# Patient Record
Sex: Female | Born: 1960 | Race: White | Hispanic: No | Marital: Married | Smoking: Former smoker
Health system: Southern US, Community
[De-identification: ages and names within clinical notes are randomized; demographics above are authoritative.]

## PROBLEM LIST (undated history)

## (undated) DIAGNOSIS — A64 Unspecified sexually transmitted disease: Secondary | ICD-10-CM

## (undated) DIAGNOSIS — N390 Urinary tract infection, site not specified: Secondary | ICD-10-CM

## (undated) DIAGNOSIS — D649 Anemia, unspecified: Secondary | ICD-10-CM

## (undated) DIAGNOSIS — E039 Hypothyroidism, unspecified: Secondary | ICD-10-CM

## (undated) DIAGNOSIS — R87619 Unspecified abnormal cytological findings in specimens from cervix uteri: Secondary | ICD-10-CM

## (undated) DIAGNOSIS — B2790 Infectious mononucleosis, unspecified without complication: Secondary | ICD-10-CM

## (undated) DIAGNOSIS — R87810 Cervical high risk human papillomavirus (HPV) DNA test positive: Secondary | ICD-10-CM

## (undated) DIAGNOSIS — A630 Anogenital (venereal) warts: Secondary | ICD-10-CM

## (undated) DIAGNOSIS — M1990 Unspecified osteoarthritis, unspecified site: Secondary | ICD-10-CM

## (undated) DIAGNOSIS — J189 Pneumonia, unspecified organism: Secondary | ICD-10-CM

## (undated) DIAGNOSIS — B009 Herpesviral infection, unspecified: Secondary | ICD-10-CM

## (undated) HISTORY — PX: TONSILECTOMY, ADENOIDECTOMY, BILATERAL MYRINGOTOMY AND TUBES: SHX2538

## (undated) HISTORY — DX: Cervical high risk human papillomavirus (HPV) DNA test positive: R87.810

## (undated) HISTORY — DX: Unspecified abnormal cytological findings in specimens from cervix uteri: R87.619

## (undated) HISTORY — PX: CRYOTHERAPY: SHX1416

## (undated) HISTORY — DX: Anogenital (venereal) warts: A63.0

## (undated) HISTORY — DX: Herpesviral infection, unspecified: B00.9

## (undated) HISTORY — DX: Urinary tract infection, site not specified: N39.0

## (undated) HISTORY — DX: Anemia, unspecified: D64.9

## (undated) HISTORY — DX: Unspecified sexually transmitted disease: A64

## (undated) HISTORY — DX: Hypothyroidism, unspecified: E03.9

## (undated) HISTORY — DX: Unspecified osteoarthritis, unspecified site: M19.90

## (undated) HISTORY — DX: Infectious mononucleosis, unspecified without complication: B27.90

## (undated) HISTORY — DX: Pneumonia, unspecified organism: J18.9

## (undated) HISTORY — PX: BREAST CYST ASPIRATION: SHX578

---
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
## 1998-04-21 ENCOUNTER — Other Ambulatory Visit: Admission: RE | Admit: 1998-04-21 | Discharge: 1998-04-21 | Payer: Self-pay | Admitting: Obstetrics and Gynecology

## 1998-08-21 ENCOUNTER — Other Ambulatory Visit: Admission: RE | Admit: 1998-08-21 | Discharge: 1998-08-21 | Payer: Self-pay | Admitting: Obstetrics and Gynecology

## 2000-12-01 ENCOUNTER — Emergency Department (HOSPITAL_COMMUNITY): Admission: EM | Admit: 2000-12-01 | Discharge: 2000-12-02 | Payer: Self-pay | Admitting: Emergency Medicine

## 2001-05-15 ENCOUNTER — Other Ambulatory Visit: Admission: RE | Admit: 2001-05-15 | Discharge: 2001-05-15 | Payer: Self-pay | Admitting: Obstetrics and Gynecology

## 2001-11-30 ENCOUNTER — Ambulatory Visit (HOSPITAL_COMMUNITY): Admission: RE | Admit: 2001-11-30 | Discharge: 2001-11-30 | Payer: Self-pay | Admitting: Obstetrics and Gynecology

## 2001-11-30 ENCOUNTER — Encounter: Payer: Self-pay | Admitting: Obstetrics and Gynecology

## 2002-05-13 ENCOUNTER — Encounter: Admission: RE | Admit: 2002-05-13 | Discharge: 2002-06-12 | Payer: Self-pay | Admitting: Unknown Physician Specialty

## 2005-01-05 ENCOUNTER — Ambulatory Visit (HOSPITAL_COMMUNITY): Admission: RE | Admit: 2005-01-05 | Discharge: 2005-01-05 | Payer: Self-pay | Admitting: Unknown Physician Specialty

## 2006-11-21 ENCOUNTER — Ambulatory Visit (HOSPITAL_COMMUNITY): Admission: RE | Admit: 2006-11-21 | Discharge: 2006-11-21 | Payer: Self-pay | Admitting: Family Medicine

## 2007-02-06 ENCOUNTER — Ambulatory Visit: Payer: Self-pay | Admitting: Family Medicine

## 2007-09-21 ENCOUNTER — Other Ambulatory Visit: Admission: RE | Admit: 2007-09-21 | Discharge: 2007-09-21 | Payer: Self-pay | Admitting: Obstetrics & Gynecology

## 2007-11-29 ENCOUNTER — Ambulatory Visit: Payer: Self-pay | Admitting: Family Medicine

## 2007-11-29 DIAGNOSIS — J069 Acute upper respiratory infection, unspecified: Secondary | ICD-10-CM | POA: Insufficient documentation

## 2007-11-29 LAB — CONVERTED CEMR LAB: Rapid Strep: NEGATIVE

## 2007-12-05 ENCOUNTER — Ambulatory Visit (HOSPITAL_COMMUNITY): Admission: RE | Admit: 2007-12-05 | Discharge: 2007-12-05 | Payer: Self-pay | Admitting: Obstetrics and Gynecology

## 2008-11-27 ENCOUNTER — Ambulatory Visit: Payer: Self-pay | Admitting: Family Medicine

## 2008-11-27 DIAGNOSIS — E039 Hypothyroidism, unspecified: Secondary | ICD-10-CM | POA: Insufficient documentation

## 2008-11-27 DIAGNOSIS — H6590 Unspecified nonsuppurative otitis media, unspecified ear: Secondary | ICD-10-CM | POA: Insufficient documentation

## 2008-11-28 ENCOUNTER — Encounter (INDEPENDENT_AMBULATORY_CARE_PROVIDER_SITE_OTHER): Payer: Self-pay | Admitting: *Deleted

## 2008-11-28 LAB — CONVERTED CEMR LAB: TSH: 2.48 microintl units/mL (ref 0.35–5.50)

## 2009-05-13 ENCOUNTER — Ambulatory Visit: Payer: Self-pay | Admitting: Family Medicine

## 2009-05-14 ENCOUNTER — Telehealth (INDEPENDENT_AMBULATORY_CARE_PROVIDER_SITE_OTHER): Payer: Self-pay | Admitting: *Deleted

## 2009-05-14 LAB — CONVERTED CEMR LAB
Basophils Relative: 1.6 % (ref 0.0–3.0)
Calcium: 9 mg/dL (ref 8.4–10.5)
Eosinophils Absolute: 0.1 10*3/uL (ref 0.0–0.7)
Glucose, Bld: 66 mg/dL — ABNORMAL LOW (ref 70–99)
HCT: 36.8 % (ref 36.0–46.0)
Hemoglobin: 12.5 g/dL (ref 12.0–15.0)
Lymphs Abs: 1.4 10*3/uL (ref 0.7–4.0)
MCHC: 34 g/dL (ref 30.0–36.0)
Monocytes Absolute: 0.2 10*3/uL (ref 0.1–1.0)
RBC: 4.12 M/uL (ref 3.87–5.11)

## 2009-09-14 ENCOUNTER — Ambulatory Visit (HOSPITAL_COMMUNITY): Admission: RE | Admit: 2009-09-14 | Discharge: 2009-09-14 | Payer: Self-pay | Admitting: Obstetrics and Gynecology

## 2009-09-18 ENCOUNTER — Encounter: Admission: RE | Admit: 2009-09-18 | Discharge: 2009-09-18 | Payer: Self-pay | Admitting: Obstetrics and Gynecology

## 2009-11-20 ENCOUNTER — Ambulatory Visit: Payer: Self-pay | Admitting: Family Medicine

## 2009-11-20 ENCOUNTER — Telehealth: Payer: Self-pay | Admitting: Family Medicine

## 2009-11-20 LAB — CONVERTED CEMR LAB
Ketones, urine, test strip: NEGATIVE
Specific Gravity, Urine: <1.005
Urobilinogen, UA: 0.2

## 2009-11-21 ENCOUNTER — Encounter: Payer: Self-pay | Admitting: Family Medicine

## 2009-11-23 ENCOUNTER — Telehealth (INDEPENDENT_AMBULATORY_CARE_PROVIDER_SITE_OTHER): Payer: Self-pay | Admitting: *Deleted

## 2010-02-15 ENCOUNTER — Ambulatory Visit: Payer: Self-pay | Admitting: Family Medicine

## 2010-02-15 ENCOUNTER — Ambulatory Visit: Payer: Self-pay | Admitting: Radiology

## 2010-02-15 ENCOUNTER — Ambulatory Visit (HOSPITAL_BASED_OUTPATIENT_CLINIC_OR_DEPARTMENT_OTHER): Admission: RE | Admit: 2010-02-15 | Discharge: 2010-02-15 | Payer: Self-pay | Admitting: Family Medicine

## 2010-02-15 ENCOUNTER — Telehealth (INDEPENDENT_AMBULATORY_CARE_PROVIDER_SITE_OTHER): Payer: Self-pay | Admitting: *Deleted

## 2010-02-15 DIAGNOSIS — R109 Unspecified abdominal pain: Secondary | ICD-10-CM | POA: Insufficient documentation

## 2010-02-15 DIAGNOSIS — M25559 Pain in unspecified hip: Secondary | ICD-10-CM | POA: Insufficient documentation

## 2010-02-15 LAB — CONVERTED CEMR LAB
ALT: 15 units/L (ref 0–35)
Alkaline Phosphatase: 40 units/L (ref 39–117)
Bilirubin Urine: NEGATIVE
Bilirubin, Direct: 0.1 mg/dL (ref 0.0–0.3)
Blood in Urine, dipstick: NEGATIVE
Chloride: 102 meq/L (ref 96–112)
Eosinophils Relative: 0.7 % (ref 0.0–5.0)
Hemoglobin: 13.2 g/dL (ref 12.0–15.0)
MCV: 88.9 fL (ref 78.0–100.0)
Monocytes Relative: 9.1 % (ref 3.0–12.0)
Neutro Abs: 2.3 10*3/uL (ref 1.4–7.7)
Neutrophils Relative %: 51.8 % (ref 43.0–77.0)
Nitrite: NEGATIVE
Potassium: 4.1 meq/L (ref 3.5–5.1)
Protein, U semiquant: NEGATIVE
Sodium: 140 meq/L (ref 135–145)
Specific Gravity, Urine: 1.015
Total Protein: 6.9 g/dL (ref 6.0–8.3)
WBC Urine, dipstick: NEGATIVE
pH: 7

## 2010-02-16 ENCOUNTER — Telehealth: Payer: Self-pay | Admitting: Family Medicine

## 2010-02-16 ENCOUNTER — Telehealth: Payer: Self-pay | Admitting: Gastroenterology

## 2010-02-17 ENCOUNTER — Encounter (INDEPENDENT_AMBULATORY_CARE_PROVIDER_SITE_OTHER): Payer: Self-pay | Admitting: *Deleted

## 2010-02-17 ENCOUNTER — Ambulatory Visit: Payer: Self-pay | Admitting: Gastroenterology

## 2010-02-18 ENCOUNTER — Encounter: Payer: Self-pay | Admitting: Gastroenterology

## 2010-02-26 ENCOUNTER — Encounter: Payer: Self-pay | Admitting: Family Medicine

## 2010-03-02 ENCOUNTER — Encounter: Admission: RE | Admit: 2010-03-02 | Discharge: 2010-03-02 | Payer: Self-pay | Admitting: *Deleted

## 2010-03-17 ENCOUNTER — Encounter: Payer: Self-pay | Admitting: Family Medicine

## 2010-03-19 ENCOUNTER — Telehealth (INDEPENDENT_AMBULATORY_CARE_PROVIDER_SITE_OTHER): Payer: Self-pay | Admitting: *Deleted

## 2010-03-23 ENCOUNTER — Encounter: Admission: RE | Admit: 2010-03-23 | Discharge: 2010-03-23 | Payer: Self-pay | Admitting: Obstetrics and Gynecology

## 2010-04-07 ENCOUNTER — Encounter: Payer: Self-pay | Admitting: Family Medicine

## 2010-12-04 ENCOUNTER — Encounter: Payer: Self-pay | Admitting: Unknown Physician Specialty

## 2010-12-16 NOTE — Progress Notes (Signed)
Summary: Stabbing abd pain  Phone Note Call from Patient   Caller: Renee @ Dr Laury Axon [phone] Call For: Dr Jarold Motto (Doc of the Day) Summary of Call: Stabbing lower right abd pain that moves around. [place] scared she might have a parasite. First available appt May 6th-wonders if we can see her before then? Initial call taken by: Leanor Kail [facility],  February 16, 2010 3:08 PM  Follow-up for Phone Call        this is not appropriate to send to the MD..neverseen in GI  (doc of the day 02/16/10) Follow-up by: Mardella Layman MD Clementeen Graham,  February 17, 2010 8:27 AM  Additional Follow-up for Phone Call Additional follow up Details #1::        I will see her this AM.  (doc of the day 02/17/10) Additional Follow-up by: Rachael Fee MD,  February 17, 2010 9:20 AM     Appended Document: Stabbing abd pain Thanks-- I didn't know all this was going on--- her exam was not impressive when I saw her and she was having no diarrhea etc.   Thanks for seeing her.

## 2010-12-16 NOTE — Procedures (Signed)
Summary: Colonoscopy/[facility]  Colonoscopy/[facility]   Imported By: Lanelle Bal 04/21/2010 11:02:47  _____________________________________________________________________  External Attachment:    Type:   Image     Comment:   External Document

## 2010-12-16 NOTE — Progress Notes (Signed)
Summary: Concerns  Phone Note Call from Patient Call back at Work Phone [phone]   Summary of Call: Pt called and was asking if we think it could be a parasite from when she was on her mission trip. I told her since her symptoms resolved in July then more than likely not. She is concerned because the pain moves around. Do you have any suggestions? Army Fossa CMA  February 16, 2010 1:45 PM   Follow-up for Phone Call        PARASITE WOULD CAUSE DIARRHEA, FEVER ETC.   ---   HAVE RENEE DO THE gi REFERRAL--- i PUT IT IN YESTERDAY Follow-up by: Loreen Freud DO,  February 16, 2010 2:05 PM  Additional Follow-up for Phone Call Additional follow up Details #1::        Pt is aware. Army Fossa CMA  February 16, 2010 2:14 PM

## 2010-12-16 NOTE — Progress Notes (Signed)
Summary: Lab Results   Phone Note Outgoing Call   Summary of Call: Regarding results, LMTCB:  normal--  no UTI Initial call taken by: Army Fossa CMA,  November 23, 2009 12:54 PM  Follow-up for Phone Call        Pt states since november she has had a pain in her abdomen sometimes seemed related to her hip. She said that when she has a full bladder she has the pain in her abdomen. She says her UTI symptoms have cleared up mostly, still having a very small amount of discomfort. Any suggestions?  Follow-up by: Army Fossa CMA,  November 23, 2009 2:22 PM  Additional Follow-up for Phone Call Additional follow up Details #1::        She probably needs a gyn exam--if it has not been done recently---- gyn can do it or we can.  --- to find out if it is urinary or abd.   Additional Follow-up by: Loreen Freud DO,  November 23, 2009 4:48 PM    Additional Follow-up for Phone Call Additional follow up Details #2::    lmtcb. Army Fossa CMA  November 23, 2009 5:01 PM  Additional Follow-up for Phone Call Additional follow up Details #3:: Details for Additional Follow-up Action Taken: I spoke with pt and she stated that she is going to call her GYN and inform them of the new problem and set up an appt. Army Fossa CMA  November 24, 2009 10:23 AM

## 2010-12-16 NOTE — Progress Notes (Signed)
  Phone Note Other Incoming   Request: Send information Summary of Call: Request for records received from Dr. Carman Ching. Records faxed to [phone].

## 2010-12-16 NOTE — Assessment & Plan Note (Signed)
History of Present Illness Visit Type: Initial Consult Primary GI MD: Rob Bunting MD Primary Provider: Loreen Freud, MD Requesting Provider: Loreen Freud, MD Chief Complaint: Lower abdominal, parasites? History of Present Illness:     a very pleasant 50 year old woman who was recently in [place], 9 months ago. Came down with diarrhea, loose, every AM.  Would go 8-10 times every morning.  She never saw blood in stool. Took a "long time" before , she became seriously constipated for a week, then tood MOM, then more, diarrhrea returned.  She became fairly regulated, back to normal for the most part but was still very bothered by urgency in AM at times.  Prior to these issues, was having easy BM every moring.  She will have a BM every day, sometimes has to push and strain.  Started amitiza trial earlier this week.  Started to right sided abd pain, intermittent, started 4-5 months ago. Gynecologst exam was normal. The pain is constant now.  the pain is not improved when she moves her bowels. There is no associated nausea or vomiting. The pain is mild to moderate.  She had blood work earlierThis week show a normal CBC, normal complete metabolic profile.           Current Medications (verified): 1)  Synthroid 75 Mcg  Tabs (Levothyroxine Sodium) .... Take One Tablet Daily 2)  Amitiza 8 Mcg Caps (Lubiprostone) .Marland Kitchen.. 1 By Mouth Two Times A Day 3)  Multivitamins  Tabs (Multiple Vitamin) .... Take One By Mouth Once Daily  Allergies (verified): No Known Drug Allergies  Past History:  Past Medical History: Hypothyroidism pneumonia 1961 Urinary tract infections  Past Surgical History: none  Family History: grandparent had colon cancer Mother has some type of colitis Father died of sarcoma  Social History: she is married, she has one son, she works as a Patent attorney consulted, she does not smoke cigarettes, she drinks one alcoholic beverage per day she drinks 3 caffeinated  beverages per day  Review of Systems       Pertinent positive and negative review of systems were noted in the above HPI and GI specific review of systems.  All other review of systems was otherwise negative.   Vital Signs:  Patient profile:   50 year old female Height:      67.5 inches Weight:      139.4 pounds BMI:     21.59 Pulse rate:   80 / minute Pulse rhythm:   regular BP sitting:   100 / 64  (left arm) Cuff size:   regular  Vitals Entered By: Harlow Mares CMA Duncan Dull) (February 17, 2010 10:46 AM)  Physical Exam  Additional Exam:  Constitutional: generally well appearing Psychiatric: alert and oriented times 3 Eyes: extraocular movements intact Mouth: oropharynx moist, no lesions Neck: supple, no lymphadenopathy Cardiovascular: heart regular rate and rythm Lungs: CTA bilaterally Abdomen: soft, non-tender, non-distended, no obvious ascites, no peritoneal signs, normal bowel sounds Extremities: no lower extremity edema bilaterally Skin: no lesions on visible extremities    Impression & Recommendations:  Problem # 1:  right lower quadrant pain, change in bowel habits 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 A severe bacterial or viral infection such as the one she had while traveling overseas last summer can be the start of IBS chronically. The mechanism may be disruption of usual neural pathways. She is very clear that her bowels have not been normal since that acute diarrheal illness. She is also becoming more  and more bothered by a right lower quadrant pain. Perhaps she has underlying inflammatory bowel disease. She does have colitis her family and colon cancer in a grandmother. I think we should proceed with colonoscopy at her soonest convenience. I have also recommended that she try fiber supplements to try to regulate her bowels a bit better. She will stop the [place] , she is not very dramatically constipated. She will also get some routine stool tests including fecal  leukocytes, ova parasites, Giardia.  Other Orders: T-Stool for O&P (04540-98119) T-Fecal WBC ([id number]) T-Stool Giardia / Crypto- EIA (30865)  Patient Instructions: 1)  Stop the amitiza. 2)  You should begin taking citrucel powder fiber supplement (orange flavor).  Start with a small spoonful and increase this over 1 week to a full, heaping spoonful daily.  You may notice some bloating when you first start the fiber, but that usually resolves after a few days. 3)  You will be scheduled to have a colonoscopy. 4)  You will get lab test(s) done today (stool for ova/parasites, fecal leukocytes, giardia). 5)  A copy of this information will be sent to Dr. Laury Axon.  6)  The medication list was reviewed and reconciled.  All changed / newly prescribed medications were explained.  A complete medication list was provided to the patient / caregiver.  Appended Document: Orders Update/movi    Clinical Lists Changes  Medications: Added new medication of MOVIPREP 100 GM  SOLR (PEG-KCL-NACL-NASULF-NA ASC-C) As per prep instructions. - Signed Rx of MOVIPREP 100 GM  SOLR (PEG-KCL-NACL-NASULF-NA ASC-C) As per prep instructions.;  #1 x 0;  Signed;  Entered by: Chales Abrahams CMA (AAMA);  Authorized by: Rachael Fee MD;  Method used: Electronically to [id number]*, [address], [place], [place], [place]  [address], Ph: [phone], Fax: [phone] Orders: Added new Test order of Colonoscopy (Colon) - Signed    Prescriptions: MOVIPREP 100 GM  SOLR (PEG-KCL-NACL-NASULF-NA ASC-C) As per prep instructions.  #1 x 0   Entered by:   Chales Abrahams CMA (AAMA)   Authorized by:   Rachael Fee MD   Signed by:   Chales Abrahams CMA (AAMA) on 02/17/2010   Method used:   Electronically to        [facility] [address] [id number] (retail)       [address]       [place], [place]  [address]       Ph: [phone]       Fax: [phone]   RxID:    [id number]    Appended Document:  please call her,  she sent a letter asking about the differential diagnosis at this point.  Tell her 1. Post infectious IBS.  2. Mild inflammatory bowel syndrome.  3. chronic infection (although stool testing is negative so far.    colonoscopy will help figure out which it is.  Appended Document:  pt wants Dr Christella Hartigan to know that she has a strong family history of gallbladder disease.  Her mothers first cousin died from gallbladder disease and siblings have had gallbladder removed.  Appended Document:  ok

## 2010-12-16 NOTE — Letter (Signed)
Summary: [facility]   Imported By: Lanelle Bal 03/23/2010 12:28:52  _____________________________________________________________________  External Attachment:    Type:   Image     Comment:   External Document

## 2010-12-16 NOTE — Letter (Signed)
Summary: [facility]  [facility]   Imported By: Lanelle Bal 03/04/2010 12:24:50  _____________________________________________________________________  External Attachment:    Type:   Image     Comment:   External Document

## 2010-12-16 NOTE — Progress Notes (Signed)
Summary: Lab results  Phone Note Outgoing Call   Call placed by: Army Fossa CMA,  February 15, 2010 4:56 PM Summary of Call: Regarding lab results, LMTCB:  Hip X-ray: Normal All labs: Normal  Follow-up for Phone Call        Pt is aware. Army Fossa CMA  February 15, 2010 4:58 PM

## 2010-12-16 NOTE — Letter (Signed)
Summary: [facility] Instructions  [facility]  [address] [place], [place] [address]   Phone: [phone]  Fax: [phone]       Cassandra Alvarez    1960/12/24    MRN: [id number]        Procedure Day /Date:03/10/10 WED     Arrival Time:130 pm     Procedure Time:230 pm     Location of Procedure:                    X  [facility] (4th Floor)                        PREPARATION FOR COLONOSCOPY WITH MOVIPREP   Starting 5 days prior to your procedure 03/05/10 do not eat nuts, seeds, popcorn, corn, beans, peas,  salads, or any raw vegetables.  Do not take any fiber supplements (e.g. Metamucil, Citrucel, and Benefiber).  THE DAY BEFORE YOUR PROCEDURE         DATE: 03/09/10  DAY: TUE  1.  Drink clear liquids the entire day-NO SOLID FOOD  2.  Do not drink anything colored red or purple.  Avoid juices with pulp.  No orange juice.  3.  Drink at least 64 oz. (8 glasses) of fluid/clear liquids during the day to prevent dehydration and help the prep work efficiently.  CLEAR LIQUIDS INCLUDE: Water Jello Ice Popsicles Tea (sugar ok, no milk/cream) Powdered fruit flavored drinks Coffee (sugar ok, no milk/cream) Gatorade Juice: apple, white grape, white cranberry  Lemonade Clear bullion, consomm, broth Carbonated beverages (any kind) Strained chicken noodle soup Hard Candy                             4.  In the morning, mix first dose of MoviPrep solution:    Empty 1 Pouch A and 1 Pouch B into the disposable container    Add lukewarm drinking water to the top line of the container. Mix to dissolve    Refrigerate (mixed solution should be used within 24 hrs)  5.  Begin drinking the prep at 5:00 p.m. The MoviPrep container is divided by 4 marks.   Every 15 minutes drink the solution down to the next mark (approximately 8 oz) until the full liter is complete.   6.  Follow completed prep with 16 oz of clear liquid of your choice (Nothing red or purple).   Continue to drink clear liquids until bedtime.  7.  Before going to bed, mix second dose of MoviPrep solution:    Empty 1 Pouch A and 1 Pouch B into the disposable container    Add lukewarm drinking water to the top line of the container. Mix to dissolve    Refrigerate  THE DAY OF YOUR PROCEDURE      DATE: 4/27/11DAY: WED  Beginning at 930 a.m. (5 hours before procedure):         1. Every 15 minutes, drink the solution down to the next mark (approx 8 oz) until the full liter is complete.  2. Follow completed prep with 16 oz. of clear liquid of your choice.    3. You may drink clear liquids until 1230 pm (2 HOURS BEFORE PROCEDURE).   MEDICATION INSTRUCTIONS  Unless otherwise instructed, you should take regular prescription medications with a small sip of water   as early as possible the morning of your procedure.  OTHER INSTRUCTIONS  You will need a responsible adult at least 51 years of age to accompany you and drive you home.   This person must remain in the waiting room during your procedure.  Wear loose fitting clothing that is easily removed.  Leave jewelry and other valuables at home.  However, you may wish to bring a book to read or  an iPod/MP3 player to listen to music as you wait for your procedure to start.  Remove all body piercing jewelry and leave at home.  Total time from sign-in until discharge is approximately 2-3 hours.  You should go home directly after your procedure and rest.  You can resume normal activities the  day after your procedure.  The day of your procedure you should not:   Drive   Make legal decisions   Operate machinery   Drink alcohol   Return to work  You will receive specific instructions about eating, activities and medications before you leave.    The above instructions have been reviewed and explained to me by   _______________________    I fully understand and can verbalize these instructions  _____________________________ Date _________

## 2010-12-16 NOTE — Assessment & Plan Note (Signed)
Summary: re-est//pain in side//lch   Vital Signs:  Patient profile:   50 year old female Height:      67.5 inches Weight:      138 pounds BMI:     21.37 Pulse rate:   62 / minute Pulse rhythm:   regular BP sitting:   104 / 68  (left arm) Cuff size:   regular  Vitals Entered By: Army Fossa CMA (February 15, 2010 9:10 AM) CC: Pt here for discomfort in left lower abdomen, x 5 months. Sometimes it is a stabbing pain, or a burning pain that comes and goes., Abdominal Pain   History of Present Illness:       This is a 50 year old woman who presents with Abdominal Pain.  The symptoms began 6 days ago.  Pt here c/o 6 month hx abd pain R low quad--  alt loose stool and constipation- mostly constipation.  Loose stool mostly only when she takes something.  The patient reports constipation, but denies nausea, vomiting, diarrhea, melena, hematochezia, anorexia, and hematemesis.  The location of the pain is right upper quadrant.  The pain is described as constant and sharp.  The patient denies the following symptoms: fever, weight loss, dysuria, chest pain, jaundice, dark urine, missed menstrual period, and vaginal bleeding.    Current Medications (verified): 1)  Synthroid 75 Mcg  Tabs (Levothyroxine Sodium) .... Take One Tablet Daily 2)  Amitiza 8 Mcg Caps (Lubiprostone) .Marland Kitchen.. 1 By Mouth Two Times A Day  Allergies (verified): No Known Drug Allergies  Past History:  Past medical, surgical, family and social histories (including risk factors) reviewed for relevance to current acute and chronic problems.  Past Medical History: Reviewed history from 11/27/2008 and no changes required. Hypothyroidism  Family History: Reviewed history and no changes required.  Social History: Reviewed history and no changes required.  Review of Systems      See HPI  Physical Exam  General:  Well-developed,well-nourished,in no acute distress; alert,appropriate and cooperative throughout  examination Abdomen:  Bowel sounds positive,abdomen soft and non-tender without masses, organomegaly or hernias noted. Msk:  normal ROM, no joint tenderness, no joint swelling, no joint warmth, no redness over joints, no joint deformities, and no crepitation.   Extremities:  No clubbing, cyanosis, edema, or deformity noted with normal full range of motion of all joints.   Neurologic:  alert & oriented X3 and strength normal in all extremities.   Skin:  Intact without suspicious lesions or rashes Psych:  Oriented X3 and normally interactive.     Impression & Recommendations:  Problem # 1:  ABDOMINAL PAIN OTHER SPECIFIED SITE (ICD-789.09)  ? IBS---amitiza 8 mg two times a day  refer to GI try align as well Orders: T-Hip Comp Right Min 2 views (73510TC) Gastroenterology Referral (GI) TLB-TSH (Thyroid Stimulating Hormone) (84443-TSH) TLB-BMP (Basic Metabolic Panel-BMET) (80048-METABOL) TLB-CBC Platelet - w/Differential (85025-CBCD) TLB-Hepatic/Liver Function Pnl (80076-HEPATIC)  Discussed use of medications, application of heat or cold, and exercises.   Problem # 2:  HIP PAIN, RIGHT (ICD-719.45) hx djd  Orders: T-Hip Comp Right Min 2 views (73510TC)  Discussed use of medications, application of heat or cold, and exercises.   Problem # 3:  HYPOTHYROIDISM (ICD-244.9)  Her updated medication list for this problem includes:    Synthroid 75 Mcg Tabs (Levothyroxine sodium) .Marland Kitchen... Take one tablet daily  Orders: TLB-TSH (Thyroid Stimulating Hormone) (84443-TSH) TLB-BMP (Basic Metabolic Panel-BMET) (80048-METABOL) TLB-CBC Platelet - w/Differential (85025-CBCD) TLB-Hepatic/Liver Function Pnl (80076-HEPATIC)  Labs Reviewed: TSH:  2.48 (11/27/2008)     Complete Medication List: 1)  Synthroid 75 Mcg Tabs (Levothyroxine sodium) .... Take one tablet daily 2)  Amitiza 8 Mcg Caps (Lubiprostone) .Marland Kitchen.. 1 by mouth two times a day  Other Orders: Venipuncture (29518) Prescriptions: AMITIZA 8  MCG CAPS (LUBIPROSTONE) 1 by mouth two times a day  #60 x 2   Entered and Authorized by:   Loreen Freud DO   Signed by:   Loreen Freud DO on 02/15/2010   Method used:   Print then Give to Patient   RxID:   [id number]   Laboratory Results   Urine Tests    Routine Urinalysis   Color: yellow Appearance: Clear Glucose: negative   (Normal Range: Negative) Bilirubin: negative   (Normal Range: Negative) Ketone: negative   (Normal Range: Negative) Spec. Gravity: 1.015   (Normal Range: 1.003-1.035) Blood: negative   (Normal Range: Negative) pH: 7.0   (Normal Range: 5.0-8.0) Protein: negative   (Normal Range: Negative) Urobilinogen: 0.2   (Normal Range: 0-1) Nitrite: negative   (Normal Range: Negative) Leukocyte Esterace: negative   (Normal Range: Negative)    Comments: Army Fossa CMA  February 15, 2010 9:17 AM

## 2010-12-16 NOTE — Progress Notes (Signed)
Summary: UTI  Phone Note Call from Patient   Summary of Call: Pt called and stated that she has urgency and discomfort when urinating- she states she will come and leave a urine sample but would like you to call something into [facility] [address]. She says she cannot wait until we process her urine.  Initial call taken by: Army Fossa CMA,  November 20, 2009 9:42 AM  Follow-up for Phone Call        I need the urine sample before she starts medication.  It can mess up the culture. Follow-up by: Loreen Freud DO,  November 20, 2009 10:28 AM  Additional Follow-up for Phone Call Additional follow up Details #1::        Pt is aware to not take meds until after urine sample is given. She will come by and leave UA. Husbands office called in meds.   Rene Kocher- she is coming to leave a UA- culture to. she is on ur schedule.  Additional Follow-up by: Army Fossa CMA,  November 20, 2009 10:30 AM    cx sent

## 2011-01-04 ENCOUNTER — Other Ambulatory Visit: Payer: Self-pay | Admitting: Obstetrics and Gynecology

## 2011-01-04 DIAGNOSIS — Z1231 Encounter for screening mammogram for malignant neoplasm of breast: Secondary | ICD-10-CM

## 2011-01-20 ENCOUNTER — Ambulatory Visit
Admission: RE | Admit: 2011-01-20 | Discharge: 2011-01-20 | Disposition: A | Discharge status: Home / Self Care | Payer: BC Managed Care – PPO | Source: Ambulatory Visit | Attending: Obstetrics and Gynecology | Admitting: Obstetrics and Gynecology

## 2011-01-20 DIAGNOSIS — Z1231 Encounter for screening mammogram for malignant neoplasm of breast: Secondary | ICD-10-CM

## 2011-08-26 ENCOUNTER — Ambulatory Visit (INDEPENDENT_AMBULATORY_CARE_PROVIDER_SITE_OTHER): Payer: BC Managed Care – PPO | Admitting: Family Medicine

## 2011-08-26 ENCOUNTER — Encounter: Payer: Self-pay | Admitting: Family Medicine

## 2011-08-26 VITALS — BP 110/72 | HR 82 | Temp 97.0°F | Wt 127.0 lb

## 2011-08-26 DIAGNOSIS — J069 Acute upper respiratory infection, unspecified: Secondary | ICD-10-CM

## 2011-08-26 MED ORDER — FLUTICASONE PROPIONATE 50 MCG/ACT NA SUSP: NASAL | Status: DC

## 2011-08-26 MED ORDER — CEFUROXIME AXETIL 500 MG PO TABS: 500.0000 mg | ORAL_TABLET | Freq: Two times a day (BID) | ORAL | Status: AC

## 2011-08-26 NOTE — Patient Instructions (Signed)
Common Cold, Adult An upper respiratory tract infection, or cold, is a viral infection of the air passages to the lung. Colds are contagious, especially during the first 3 or 4 days. Antibiotics cannot cure a cold. Cold germs are spread by coughs, sneezes, and hand to hand contact. A respiratory tract infection usually clears up in a few days, but some people may be sick for a week or two. HOME CARE INSTRUCTIONS  Only take over-the-counter or prescription medicines for pain, discomfort, or fever as directed by your caregiver.   Be careful not to blow your nose too hard. This may cause a nosebleed.   Use a cool-mist humidifier (vaporizer) to increase air moisture. This will make it easier for you to breath. Do not use hot steam.   Rest as much as possible and get plenty of sleep.   Wash your hands often, especially after you blow your nose. Cover your mouth and nose with a tissue when you sneeze or cough.   Drink at least 8 glasses of clear liquids every day, such as water, fruit juices, tea, clear soups, and carbonated beverages.  SEEK MEDICAL CARE IF:  An oral temperature above 100.4 lasts 4 days or more, and is not controlled by medication.   You have a sore throat that gets worse or you see white or yellow spots in your throat.   Your cough gets worse or lasts more than 10 days.   You have a rash somewhere on your skin. You have large and tender lumps in your neck.   You have an earache or a headache.   You have thick, greenish or yellowish discharge from your nose.   You cough-up thick yellow, green, gray or bloody mucus (secretions).  SEEK IMMEDIATE MEDICAL CARE IF: You have trouble breathing, chest pain, or your skin or nails look gray or blue. MAKE SURE YOU:   Understand these instructions.   Will watch your condition.   Will get help right away if you are not doing well or get worse.  Document Released: 10/28/2000 Document Re-Released: 10/13/2008 [employer] Patient  Information 2011 [employer]. 

## 2011-08-26 NOTE — Progress Notes (Signed)
  Subjective:     Cassandra Alvarez is a 50 y.o. female who presents for evaluation of symptoms of a URI. Symptoms include bilateral ear pressure/pain, congestion, nasal congestion and sinus pressure. Onset of symptoms was 5 days ago, and has been gradually worsening since that time. Treatment to date: antihistamines and decongestants.  The following portions of the patient's history were reviewed and updated as appropriate: allergies, current medications, past family history, past medical history, past social history, past surgical history and problem list.  Review of Systems Pertinent items are noted in HPI.   Objective:    BP 110/72  Pulse 82  Temp(Src) 97 F (36.1 C) (Oral)  Wt 127 lb (57.607 kg)  SpO2 96% General appearance: alert, cooperative, appears stated age and no distress Ears: + fluid b/l --- R>L Nose: Nares normal. Septum midline. Mucosa normal. No drainage or sinus tenderness. Throat: lips, mucosa, and tongue normal; teeth and gums normal Neck: no adenopathy, no carotid bruit, no JVD, supple, symmetrical, trachea midline and thyroid not enlarged, symmetric, no tenderness/mass/nodules Lungs: clear to auscultation bilaterally Heart: regular rate and rhythm, S1, S2 normal, no murmur, click, rub or gallop Extremities: extremities normal, atraumatic, no cyanosis or edema   Assessment:    viral upper respiratory illness   Plan:    Discussed diagnosis and treatment of URI. Suggested symptomatic OTC remedies. Nasal steroids per orders. Follow up as needed.

## 2012-04-14 HISTORY — PX: BUNIONECTOMY: SHX129

## 2012-06-14 ENCOUNTER — Telehealth: Payer: Self-pay | Admitting: *Deleted

## 2012-06-14 NOTE — Telephone Encounter (Signed)
Pt returned call and left vm, advised MD Tabori instructions and findings, she understood all and will call office if any further concerns arise.

## 2012-06-14 NOTE — Telephone Encounter (Signed)
Pt called office this am concerned about her mother that was to come see MD Tabori today and was concerned with pt (mother) Daniel Nones current condition however did not want the pt (mother) to know she has called, also noted pt Jeffie Widdowson is listed on DPR, MD Beverely Low observed concerns from daughter noting pt Daniel Nones has been touching face/mouth when anxious/nervous as well as pt (mother) moving her hand back and forth on table and tapping her foot as well, MD Tabori observed pt and gave instructions to this nurse to call pt daughter Alexia Dinger to advise she observed Daniel Nones during office visit today and did not note any concerns due to nervousness or anxiety however she will continue to monitor this concern during ongoing OV in the future for the pt, pt Ahriana Fedora unavailable left vm to call office with extension left to call back

## 2013-01-30 ENCOUNTER — Ambulatory Visit: Payer: Self-pay | Admitting: Certified Nurse Midwife

## 2013-01-30 ENCOUNTER — Encounter: Payer: Self-pay | Admitting: Certified Nurse Midwife

## 2013-02-08 ENCOUNTER — Other Ambulatory Visit: Payer: Self-pay | Admitting: Certified Nurse Midwife

## 2013-02-08 NOTE — Telephone Encounter (Signed)
Patient has annual appt 03/04/13. rx sent for #90 with no refills. Pt notified

## 2013-03-04 ENCOUNTER — Ambulatory Visit (INDEPENDENT_AMBULATORY_CARE_PROVIDER_SITE_OTHER): Payer: BC Managed Care – PPO | Admitting: Certified Nurse Midwife

## 2013-03-04 ENCOUNTER — Encounter: Payer: Self-pay | Admitting: Certified Nurse Midwife

## 2013-03-04 VITALS — BP 100/64 | Ht 67.75 in | Wt 126.0 lb

## 2013-03-04 DIAGNOSIS — E039 Hypothyroidism, unspecified: Secondary | ICD-10-CM

## 2013-03-04 DIAGNOSIS — Z0000 Encounter for general adult medical examination without abnormal findings: Secondary | ICD-10-CM

## 2013-03-04 DIAGNOSIS — Z01419 Encounter for gynecological examination (general) (routine) without abnormal findings: Secondary | ICD-10-CM

## 2013-03-04 DIAGNOSIS — Z23 Encounter for immunization: Secondary | ICD-10-CM

## 2013-03-04 LAB — POCT URINALYSIS DIPSTICK
Bilirubin, UA: NEGATIVE
Blood, UA: NEGATIVE
Glucose, UA: NEGATIVE
Ketones, UA: NEGATIVE
Nitrite, UA: NEGATIVE
pH, UA: 5

## 2013-03-04 MED ORDER — TETANUS-DIPHTH-ACELL PERTUSSIS 5-2.5-18.5 LF-MCG/0.5 IM SUSP: 0.5000 mL | Freq: Once | INTRAMUSCULAR | Status: DC

## 2013-03-04 NOTE — Progress Notes (Signed)
52 y.o. MarriedCaucasian female   G1P1001 here for annual exam. Menopausal no HRT , no vaginal bleeding .  Occasional vaginal dryness, uses OTC moisturizer with good result. Recent left foot bunion surgery, doing well with recovery. Thyroid medication working well, has not noted any symptoms or concerns.  No health issues today.  No HSV outbreaks, no Rx needed.  Patient's last menstrual period was 07/16/2011.          Sexually active: yes  The current method of family planning is vasectomy.    Exercising: no  exercise Last mammogram: 2013 Last pap: 01-26-12 neg Last BMD: 2013 Alcohol: 4-5 a week Tobacco: none Colonoscopy: 2011 negative  Health Maintenance  Topic Date Due  . Pap Smear  07/16/1978  . Tetanus/tdap  07/17/1979  . Colonoscopy  07/16/2010  . Mammogram  01/19/2013  . Influenza Vaccine  07/15/2013    Family History  Problem Relation Age of Onset  . Colitis Mother   . Osteoporosis Mother   . Autoimmune disease Mother     sjogren's, reynaud's, external lupus  . Cancer Father     liposarcoma  . Cancer Maternal Grandfather     colon  . Hypertension Maternal Grandfather   . Hypertension Maternal Grandmother   . Hypertension Paternal Grandmother   . Cancer Paternal Grandmother     Patient Active Problem List  Diagnosis  . HYPOTHYROIDISM  . OTITIS MEDIA, SEROUS  . URI  . HIP PAIN, RIGHT  . ABDOMINAL PAIN OTHER SPECIFIED SITE    Past Medical History  Diagnosis Date  . UTI (urinary tract infection)   . Pneumonia 1961  . Hypothyroidism   . HSV-2 (herpes simplex virus 2) infection   . Cervical high risk HPV (human papillomavirus) test positive   . Anemia     past  . Mononucleosis     Past Surgical History  Procedure Laterality Date  . Tonsilectomy, adenoidectomy, bilateral myringotomy and tubes    . Cryotherapy    . Breast cyst aspiration      left breast aspiration-negative    Allergies: Review of patient's allergies indicates no known  allergies.  Current Outpatient Prescriptions  Medication Sig Dispense Refill  . levothyroxine (SYNTHROID, LEVOTHROID) 88 MCG tablet TAKE ONE TABLET BY MOUTH DAILY  90 tablet  0  . Multiple Vitamins-Minerals (MULTIVITAMIN) LIQD Take by mouth daily.       No current facility-administered medications for this visit.    ROS: A comprehensive review of systems was negative.  Exam:    BP 100/64  Ht 5' 7.75" (1.721 m)  Wt 126 lb (57.153 kg)  BMI 19.3 kg/m2  LMP 07/16/2011 Weight change: @WEIGHTCHANGE @ Last 3 height recordings:  Ht Readings from Last 3 Encounters:  03/04/13 5' 7.75" (1.721 m)  02/17/10 5' 7.5" (1.715 m)   General appearance: alert and cooperative Head: Normocephalic, without obvious abnormality, atraumatic Neck: no adenopathy, supple, symmetrical, trachea midline and thyroid not enlarged, symmetric, no tenderness/mass/nodules Lungs: clear to auscultation bilaterally Breasts: normal appearance, no masses or tenderness Heart: regular rate and rhythm Abdomen: soft, non-tender; bowel sounds normal; no masses,  no organomegaly Extremities: extremities normal, atraumatic, no cyanosis or edema Skin: Skin color, texture, turgor normal. No rashes or lesions Lymph nodes: Cervical, supraclavicular, and axillary nodes normal. no inguinal nodes palpated Neurologic: Grossly normal   Pelvic: External genitalia:  no lesions and well estrogenized              Urethra: normal appearing urethra with no masses, tenderness or  lesions              Bartholins and Skenes: normal, Bartholin's, Urethra, Skene's normal                 Vagina: normal appearing vagina with normal color and discharge, no lesions              Cervix: normal appearance              Pap taken: no        Bimanual Exam:  Uterus:  uterus is normal size, shape, consistency and nontender, anteverted                                      Adnexa:    normal adnexa in size, nontender and no masses                                       Rectovaginal: Confirms                                      Anus:  normal sphincter tone, no lesions  A: Normal well woman exam Menopausal no HRT Immunization update, TDAP Bunionectomy Left foot  Under follow up Hypothyroid stable medication   P: Reviewed health and wellness pertinent to exam   Aware of need to evaluate if vaginal bleeding Requests TDAP Lab: TSH, ABO,RH(patient requested due to concerned if needed blood would like information) Will renew thyroid medication if TSH normal, patient has 30 days of Synthroid Pap yearly or as indicated Mammogram yearly  Reviewed, TL

## 2013-03-04 NOTE — Patient Instructions (Signed)

## 2013-03-05 MED ORDER — LEVOTHYROXINE SODIUM 88 MCG PO TABS: ORAL_TABLET | ORAL | Status: DC

## 2013-07-17 ENCOUNTER — Other Ambulatory Visit: Payer: Self-pay

## 2013-07-17 ENCOUNTER — Other Ambulatory Visit: Payer: Self-pay | Admitting: Certified Nurse Midwife

## 2013-07-17 DIAGNOSIS — Z1231 Encounter for screening mammogram for malignant neoplasm of breast: Secondary | ICD-10-CM

## 2013-07-18 ENCOUNTER — Ambulatory Visit
Admission: RE | Admit: 2013-07-18 | Discharge: 2013-07-18 | Disposition: A | Discharge status: Home / Self Care | Payer: BC Managed Care – PPO | Source: Ambulatory Visit

## 2013-07-18 DIAGNOSIS — Z1231 Encounter for screening mammogram for malignant neoplasm of breast: Secondary | ICD-10-CM

## 2013-09-07 ENCOUNTER — Other Ambulatory Visit: Payer: Self-pay | Admitting: Certified Nurse Midwife

## 2013-09-09 NOTE — Telephone Encounter (Signed)
aex was 03-04-13 & tsh level was checked.it was normal & rx was sent to pharmacy for only 90 days with no refill. Pt requesting refill. Pts chart is in your door. Please approve or deny rx

## 2013-10-31 ENCOUNTER — Ambulatory Visit: Payer: BC Managed Care – PPO

## 2013-11-05 ENCOUNTER — Ambulatory Visit: Payer: BC Managed Care – PPO

## 2013-11-19 ENCOUNTER — Ambulatory Visit (INDEPENDENT_AMBULATORY_CARE_PROVIDER_SITE_OTHER): Payer: BC Managed Care – PPO

## 2013-11-19 DIAGNOSIS — Z23 Encounter for immunization: Secondary | ICD-10-CM

## 2013-12-04 ENCOUNTER — Telehealth: Payer: Self-pay

## 2013-12-04 NOTE — Telephone Encounter (Signed)
Call from patient and she stated [place] to [place] due to her mother and step father being sick and she wanted to drop off the insurance forms so Dr.Lowne can complete them. I advised her to drop them off and I will forward to the Provider. She voiced understanding.      KP

## 2013-12-12 ENCOUNTER — Other Ambulatory Visit: Payer: Self-pay | Admitting: Certified Nurse Midwife

## 2013-12-12 NOTE — Telephone Encounter (Signed)
Last AEX and labs normal on 03/04/2013  Last refill 08/2013 #90/0 refills Next appt 03/05/2014  Will refill until next appt.

## 2013-12-24 ENCOUNTER — Telehealth: Payer: Self-pay | Admitting: *Deleted

## 2013-12-24 NOTE — Telephone Encounter (Signed)
12/24/2013 Pt dropped off Physician Statement Form for Dr. Laury AxonLowne to fill out.  These are required travel insurance claim forms so that she can be reimbursed for airline expenses associated with the trip she cancelled in order to be with her mother. Please call her [phone] when they are ready to be picked up.  Put in paperwork in [employer] folder at the front desk.  bw

## 2013-12-24 NOTE — Telephone Encounter (Signed)
Paperwork completed and placed in Dr. Lowne's blue folder for her signature.  

## 2013-12-24 NOTE — Telephone Encounter (Signed)
Forms copied, billing sheet attached and filled out. Put in FedEx[employer]andra Johnson's folder for completion. JG//CMA

## 2013-12-26 NOTE — Telephone Encounter (Signed)
Patient called and informed that paperwork is ready for pick up. Forms copied, sent to batch and logged. JG//CMA

## 2014-03-05 ENCOUNTER — Ambulatory Visit: Payer: BC Managed Care – PPO | Admitting: Certified Nurse Midwife

## 2014-03-11 ENCOUNTER — Other Ambulatory Visit: Payer: Self-pay | Admitting: Certified Nurse Midwife

## 2014-03-12 NOTE — Telephone Encounter (Signed)
[facility] Last filled - 12/12/13, #90 X 0 Last AEX - 03/04/13 Next AEX - cancelled, not rescheduled Last TSH - 03/04/13 Please advise refills.

## 2014-04-11 ENCOUNTER — Encounter: Payer: Self-pay | Admitting: Certified Nurse Midwife

## 2014-04-30 ENCOUNTER — Telehealth: Payer: Self-pay | Admitting: Family Medicine

## 2014-04-30 NOTE — Telephone Encounter (Signed)
Called patient to ensure that she was okay.  Pt stated that she was fine.  Denies shortness of breath or other worrisome symptoms.  Stated she had a choking episode earlier today and an appointment with Dr. Laury AxonLowne has been scheduled for tomorrow, Thursday, June 18th at 3:30pm.

## 2014-04-30 NOTE — Telephone Encounter (Signed)
Patient Information:  Caller Name: Cassandra Alvarez  Phone: [phone]  Patient: Cassandra Alvarez, Cassandra Alvarez  Gender: Female  DOB: 02/19/1961  Age: 53 Years  PCP: Lelon PerlaLowne, Yvonne R.  Pregnant: No  Office Follow Up:  Does the office need to follow up with this patient?: No  Instructions For The Office: N/A  RN Note:  Last choking episode was today about 1330 while sitting at table drinking ice tea. Explained patients are Cassandra Morinusuaslly seen by PCP before referral to specialist; getting appointment for new patient with a specialist can take weeks.   [facility]; Declined to be seen at another office;  "I can wait a couple of days."  Requested appointment for 05/01/14.  After extended hold for office scheduler for future appointment, decided to hang up and call scheduler directly at a more convenient time.  Symptoms  Reason For Call & Symptoms: Called to ask if should see PCP or ENT for 2 episodes of choking in past 2-3 days. while drinking fluid.  Reviewed Health History In EMR: Yes  Reviewed Medications In EMR: Yes  Reviewed Allergies In EMR: Yes  Reviewed Surgeries / Procedures: Yes  Date of Onset of Symptoms: 04/27/2014  Treatments Tried: stopped care, got out and raised arms  Treatments Tried Worked: Yes OB / GYN:  LMP: Unknown  Guideline(s) Used:  Breathing Difficulty  Disposition Per Guideline:   Go to Office Now  Reason For Disposition Reached:   Patient wants to be seen  Advice Given:  N/A  RN Overrode Recommendation:  Follow Up With Office Later  Prefers to wait to see her PCP since no acute symptoms.

## 2014-05-01 ENCOUNTER — Encounter: Payer: Self-pay | Admitting: Family Medicine

## 2014-05-01 ENCOUNTER — Ambulatory Visit (INDEPENDENT_AMBULATORY_CARE_PROVIDER_SITE_OTHER): Payer: BC Managed Care – PPO | Admitting: Family Medicine

## 2014-05-01 VITALS — BP 120/80 | HR 83 | Temp 98.9°F | Wt 137.0 lb

## 2014-05-01 DIAGNOSIS — T17320A Food in larynx causing asphyxiation, initial encounter: Secondary | ICD-10-CM | POA: Insufficient documentation

## 2014-05-01 DIAGNOSIS — T17308A Unspecified foreign body in larynx causing other injury, initial encounter: Secondary | ICD-10-CM

## 2014-05-01 NOTE — Progress Notes (Signed)
Pre visit review using our clinic review tool, if applicable. No additional management support is needed unless otherwise documented below in the visit note. 

## 2014-05-01 NOTE — Progress Notes (Signed)
   Subjective:    Patient ID: Cassandra Alvarez, female    DOB: 05/18/1961, 53 y.o.   MRN: [id number]  HPI Pt here c/o 4 episodes of choking while eating or drinking.  everytime occurred while doing something else as well--  Pt doesnot normally have any trouble swallowing.  No chest pain or heartburn.   Trouble starts before she actuall swallows.      Review of Systems    as above Objective:   Physical Exam BP 120/80  Pulse 83  Temp(Src) 98.9 F (37.2 C) (Oral)  Wt 137 lb (62.143 kg)  SpO2 98%  LMP 07/16/2011 General appearance: alert, cooperative, appears stated age and no distress Nose: Nares normal. Septum midline. Mucosa normal. No drainage or sinus tenderness. Throat: lips, mucosa, and tongue normal; teeth and gums normal Neck: no adenopathy, supple, symmetrical, trachea midline and thyroid not enlarged, symmetric, no tenderness/mass/nodules Lungs: clear to auscultation bilaterally Heart: regular rate and rhythm, S1, S2 normal, no murmur, click, rub or gallop       Assessment & Plan:  1. Choking due to food in larynx Do not eat/drink while doing anything else - Ambulatory referral to ENT

## 2014-05-09 ENCOUNTER — Other Ambulatory Visit (HOSPITAL_COMMUNITY): Payer: Self-pay | Admitting: Otolaryngology

## 2014-05-09 DIAGNOSIS — T17308A Unspecified foreign body in larynx causing other injury, initial encounter: Secondary | ICD-10-CM

## 2014-05-09 DIAGNOSIS — T17320S Food in larynx causing asphyxiation, sequela: Secondary | ICD-10-CM

## 2014-05-12 ENCOUNTER — Other Ambulatory Visit (HOSPITAL_COMMUNITY): Payer: Self-pay | Admitting: Otolaryngology

## 2014-05-12 DIAGNOSIS — R1314 Dysphagia, pharyngoesophageal phase: Secondary | ICD-10-CM

## 2014-05-12 DIAGNOSIS — T17308S Unspecified foreign body in larynx causing other injury, sequela: Secondary | ICD-10-CM

## 2014-05-14 ENCOUNTER — Ambulatory Visit (HOSPITAL_COMMUNITY): Payer: BC Managed Care – PPO

## 2014-05-14 ENCOUNTER — Ambulatory Visit (HOSPITAL_COMMUNITY)
Admission: RE | Admit: 2014-05-14 | Discharge: 2014-05-14 | Disposition: A | Discharge status: Home / Self Care | Payer: BC Managed Care – PPO | Source: Ambulatory Visit | Attending: Otolaryngology | Admitting: Otolaryngology

## 2014-05-14 ENCOUNTER — Other Ambulatory Visit (HOSPITAL_COMMUNITY): Payer: BC Managed Care – PPO

## 2014-05-14 DIAGNOSIS — R1314 Dysphagia, pharyngoesophageal phase: Secondary | ICD-10-CM

## 2014-05-14 DIAGNOSIS — T17308A Unspecified foreign body in larynx causing other injury, initial encounter: Secondary | ICD-10-CM

## 2014-05-14 DIAGNOSIS — R6889 Other general symptoms and signs: Secondary | ICD-10-CM | POA: Insufficient documentation

## 2014-05-14 DIAGNOSIS — T17308S Unspecified foreign body in larynx causing other injury, sequela: Secondary | ICD-10-CM

## 2014-05-14 DIAGNOSIS — R1310 Dysphagia, unspecified: Secondary | ICD-10-CM | POA: Insufficient documentation

## 2014-05-14 NOTE — Procedures (Signed)
Objective Swallowing Evaluation: Modified Barium Swallowing Study  Patient Details  Name: Cassandra Alvarez MRN: [id number] Date of Birth: 12/14/1960  Today's Date: 05/14/2014 Time: [id number] SLP Time Calculation (min): 25 min  Past Medical History:  Past Medical History  Diagnosis Date  . UTI (urinary tract infection)   . Pneumonia 1961  . Hypothyroidism   . HSV-2 (herpes simplex virus 2) infection   . Cervical high risk HPV (human papillomavirus) test positive   . Anemia     past  . Mononucleosis    Past Surgical History:  Past Surgical History  Procedure Laterality Date  . Tonsilectomy, adenoidectomy, bilateral myringotomy and tubes    . Cryotherapy    . Breast cyst aspiration      left breast aspiration-negative  . Bunionectomy  6/13   HPI:  53 yo female [facility] due to pt complaint of choking while eating.  Pt reports this occurs occasionally over the last five years (approx 5 times) but has occured 3 times in June on liquids.  Pt said she used to be able to break up choking episode with coughing but now feels like her airway is closed off and she can't catch her breath.  Pt denies pulmonary infections, weight loss, significant reflux issues nor requiring heimlich maneuver.  She did states last episodes with liquids occured when pt was "sucking" to bring bolus into mouth - eg: tasting liquid from spoon, drinking from bottle and straw.  She has been taking liquid vitamins and reports haivng a hard time swallowing large or coarse pills.       Assessment / Plan / Recommendation Clinical Impression  Dysphagia Diagnosis: Within Functional Limits  Clinical impression: Functional oropharyngeal swallow ability that was timely and strong without laryngeal penetration or aspiration.  SLP did not test barium tablet as she is to be barium swallow immediately after this test.  Pt naturally takes small bites/sips which SLP suspects is compensatory for her  occasional episodes.  Only trace pyriform sinus residual noted with liquids.  Pt did not have choking episode during testing.    Educated pt to findings using video and verbal feedback and advised pt to try to "pant" if issue occurs to take pressure away from larynx.  Also advised she consider practicing abdominal breathing for relaxation as she reports to "feel tight" in throat.      Recommended pt avoid use of straws or consuming in ways that causes her to "inhale" to bring bolus into mouth as previous 3 events with drinks occured with this posture per pt.     Treatment Recommendation    n/a   Diet Recommendation Regular;Thin liquid   Liquid Administration via: Cup Medication Administration:  (as tolerated, advised to take large pills with masticated food if eases clearance) Supervision: Patient able to self feed Compensations: Slow rate;Small sips/bites (consume liquids t/o meal) Postural Changes and/or Swallow Maneuvers: Seated upright 90 degrees;Upright 30-60 min after meal    Other  Recommendations Oral Care Recommendations: Oral care BID   Follow Up Recommendations  None           SLP Swallow Goals     General Date of Onset: 05/14/14 HPI: 53 yo female referred by Dr Emeline DarlingGore for MBS and esophagram due to pt complaint of choking while eating.  Pt reports this occurs occasionally over the last five years (approx 5 times) but has occured 3 times in June on liquids.  Pt said she used to  be able to break up choking episode with coughing but now feels like her airway is closed off and she can't catch her breath.  Pt denies pulmonary infections, weight loss, significant reflux issues nor requiring heimlich maneuver.  She did states last episodes with liquids occured when pt was "sucking" to bring bolus into mouth - eg: tasting liquid from spoon, drinking from bottle and straw.  She has been taking liquid vitamins and reports haivng a hard time swallowing large or coarse pills.   Type of  Study: Modified Barium Swallowing Study Reason for Referral: Objectively evaluate swallowing function Diet Prior to this Study: Regular;Thin liquids Temperature Spikes Noted: No Respiratory Status: Room air History of Recent Intubation: No Behavior/Cognition: Alert Oral Cavity - Dentition: Adequate natural dentition Oral Motor / Sensory Function: Within functional limits Self-Feeding Abilities: Able to feed self Patient Positioning: Upright in bed Baseline Vocal Quality: Clear Volitional Cough: Strong Volitional Swallow: Able to elicit Anatomy: Within functional limits Pharyngeal Secretions: Not observed secondary MBS    Reason for Referral Objectively evaluate swallowing function   Oral Phase Oral Preparation/Oral Phase Oral Phase: WFL Oral - Nectar Oral - Nectar Cup: Within functional limits Oral - Thin Oral - Thin Cup: Within functional limits Oral - Thin Straw: Within functional limits Oral - Solids Oral - Puree: Within functional limits Oral - Regular: Within functional limits   Pharyngeal Phase Pharyngeal - Nectar Pharyngeal - Nectar Cup: Within functional limits Pharyngeal - Thin Pharyngeal - Thin Cup: Within functional limits;Pharyngeal residue - pyriform sinuses (trace residuals in pyriform sinus region) Pharyngeal - Thin Straw: Within functional limits Pharyngeal - Solids Pharyngeal - Puree: Within functional limits Pharyngeal - Regular: Within functional limits  Cervical Esophageal Phase    GO    Cervical Esophageal Phase Cervical Esophageal Phase: WFL (appears with mildly slow clearance distally of thicker consistenies, liquids faciliated clearance - radiologist not present) Cervical Esophageal Phase - Nectar Nectar Cup: Within functional limits Cervical Esophageal Phase - Thin Thin Cup: Within functional limits Thin Straw: Within functional limits Cervical Esophageal Phase - Solids Puree: Within functional limits Regular: Within functional  limits    Functional Assessment Tool Used: mbs, clinical judgement Functional Limitations: Swallowing Swallow Current Status ([id number]): At least 1 percent but less than 20 percent impaired, limited or restricted Swallow Goal Status [phone]): At least 1 percent but less than 20 percent impaired, limited or restricted Swallow Discharge Status [phone]([id number]): At least 1 percent but less than 20 percent impaired, limited or restricted    Mills KollerKimball, Aalyssa Elderkin Ann Karleen Seebeck, MS [facility] SLP [phone]

## 2014-07-02 ENCOUNTER — Other Ambulatory Visit (INDEPENDENT_AMBULATORY_CARE_PROVIDER_SITE_OTHER): Payer: BC Managed Care – PPO

## 2014-07-02 DIAGNOSIS — E039 Hypothyroidism, unspecified: Secondary | ICD-10-CM

## 2014-07-03 LAB — TSH: TSH: 1.59 u[IU]/mL (ref 0.35–4.50)

## 2014-07-07 ENCOUNTER — Other Ambulatory Visit: Payer: Self-pay | Admitting: Family Medicine

## 2014-07-07 MED ORDER — LEVOTHYROXINE SODIUM 88 MCG PO TABS: ORAL_TABLET | ORAL | Status: DC

## 2014-07-07 NOTE — Telephone Encounter (Signed)
Caller name: [place] Relation to pt: Call back number:[phone]  Pharmacy: [facility]    Reason for call:  Pt states the pharmacy has to have [place] call it in because it was thru a different provider.  She also wants it for 90 days.   rx levothyroxine (SYNTHROID, LEVOTHROID) 88 MCG tablet

## 2014-07-07 NOTE — Telephone Encounter (Signed)
[facility], next our office.

## 2014-07-07 NOTE — Telephone Encounter (Signed)
Which [facility]. Please advise    KP

## 2014-09-15 ENCOUNTER — Encounter: Payer: Self-pay | Admitting: Family Medicine

## 2014-09-16 ENCOUNTER — Ambulatory Visit (INDEPENDENT_AMBULATORY_CARE_PROVIDER_SITE_OTHER): Payer: BC Managed Care – PPO

## 2014-09-16 ENCOUNTER — Ambulatory Visit: Payer: BC Managed Care – PPO

## 2014-09-16 DIAGNOSIS — Z23 Encounter for immunization: Secondary | ICD-10-CM

## 2015-07-23 ENCOUNTER — Other Ambulatory Visit: Payer: Self-pay

## 2015-07-23 MED ORDER — LEVOTHYROXINE SODIUM 88 MCG PO TABS: ORAL_TABLET | ORAL | Status: DC

## 2015-07-28 ENCOUNTER — Other Ambulatory Visit: Payer: Self-pay | Admitting: Family Medicine

## 2015-09-07 ENCOUNTER — Telehealth: Payer: Self-pay

## 2015-09-07 MED ORDER — LEVOTHYROXINE SODIUM 88 MCG PO TABS: 88.0000 ug | ORAL_TABLET | Freq: Every day | ORAL | Status: DC

## 2015-09-07 NOTE — Telephone Encounter (Signed)
Discussed with patient and she verbalized understanding that she needed her visit. Rx has been sent for a 30 day supply and she has agreed to keep her apt.      KP

## 2015-09-07 NOTE — Telephone Encounter (Signed)
Patient called in to schedule appt for labs so that she could get refill for SYNTHROID, LEVOTHROID, I informed the patient that based on the note in the system from Dr. Laury AxonLowne she was due for an appointment for her medication refill.  She stated she was took her last pill today and wanted to know if she came in tomorrow for her appointment and did lab work after seeing Dr. Laury AxonLowne would this throw her blood work off? As of now she is scheduled to come in 09/08/15 at 1:00pm for a Dr. Visit. She said she usually only does the labs and that a visit with the doctor has not been require in the past for her to get her medication refilled. Pls advise can the patient come for blood work only to get this medication refilled

## 2015-09-08 ENCOUNTER — Ambulatory Visit: Payer: Self-pay | Admitting: Family Medicine

## 2015-09-08 ENCOUNTER — Telehealth: Payer: Self-pay | Admitting: Family Medicine

## 2015-09-08 NOTE — Telephone Encounter (Signed)
Pt was no show today 09/08/15 1:00pm for follow up/med refills appt, pt left msg at 11:52am stating that she left earlier msg (per Tiffany another msg today 8:27am) stating that she had work conflict and could not come in, per notes pt verbalized to [employer]Kim yesterday she would be in, charge or no charge?

## 2015-09-08 NOTE — Telephone Encounter (Signed)
No charge. 

## 2015-09-11 ENCOUNTER — Ambulatory Visit (INDEPENDENT_AMBULATORY_CARE_PROVIDER_SITE_OTHER): Payer: BLUE CROSS/BLUE SHIELD | Admitting: Family Medicine

## 2015-09-11 ENCOUNTER — Encounter: Payer: Self-pay | Admitting: Family Medicine

## 2015-09-11 VITALS — BP 110/70 | HR 78 | Temp 98.3°F | Ht 68.0 in | Wt 135.0 lb

## 2015-09-11 DIAGNOSIS — E039 Hypothyroidism, unspecified: Secondary | ICD-10-CM | POA: Diagnosis not present

## 2015-09-11 LAB — TSH: TSH: 1.31 u[IU]/mL (ref 0.35–4.50)

## 2015-09-11 NOTE — Progress Notes (Signed)
Subjective:    Patient ID: Cassandra Alvarez, female    DOB: 10/16/1961, 54 y.o.   MRN: [id number]  HPI  Patient here for f/u thyroid.  No complaints.     Past Medical History  Diagnosis Date  . UTI (urinary tract infection)   . Pneumonia 1961  . Hypothyroidism   . HSV-2 (herpes simplex virus 2) infection   . Cervical high risk HPV (human papillomavirus) test positive   . Anemia     past  . Mononucleosis     Review of Systems  Constitutional: Negative for diaphoresis, appetite change, fatigue and unexpected weight change.  Eyes: Negative for pain, redness and visual disturbance.  Respiratory: Negative for cough, chest tightness, shortness of breath and wheezing.   Cardiovascular: Negative for chest pain, palpitations and leg swelling.  Endocrine: Negative for cold intolerance, heat intolerance, polydipsia, polyphagia and polyuria.  Genitourinary: Negative for dysuria, frequency and difficulty urinating.  Neurological: Negative for dizziness, light-headedness, numbness and headaches.       Objective:    Physical Exam  Constitutional: She is oriented to person, place, and time. She appears well-developed and well-nourished.  HENT:  Head: Normocephalic and atraumatic.  Eyes: Conjunctivae and EOM are normal.  Neck: Normal range of motion. Neck supple. No JVD present. Carotid bruit is not present. No thyromegaly present.  Cardiovascular: Normal rate, regular rhythm and normal heart sounds.   No murmur heard. Pulmonary/Chest: Effort normal and breath sounds normal. No respiratory distress. She has no wheezes. She has no rales. She exhibits no tenderness.  Musculoskeletal: She exhibits no edema.  Neurological: She is alert and oriented to person, place, and time.  Psychiatric: She has a normal mood and affect. Her behavior is normal.    BP 110/70 mmHg  Pulse 78  Temp(Src) 98.3 F (36.8 C) (Oral)  Ht 5\' 8"  (1.727 m)  Wt 135 lb (61.236 kg)  BMI 20.53 kg/m2  SpO2 98%  LMP  07/16/2011 Wt Readings from Last 3 Encounters:  09/11/15 135 lb (61.236 kg)  05/01/14 137 lb (62.143 kg)  03/04/13 126 lb (57.153 kg)     Lab Results  Component Value Date   WBC 4.5 02/15/2010   HGB 13.2 02/15/2010   HCT 38.6 02/15/2010   PLT 288.0 02/15/2010   GLUCOSE 83 02/15/2010   ALT 15 02/15/2010   AST 19 02/15/2010   NA 140 02/15/2010   K 4.1 02/15/2010   CL 102 02/15/2010   CREATININE 0.7 02/15/2010   BUN 15 02/15/2010   CO2 30 02/15/2010   TSH 1.59 07/03/2014    Dg Esophagus  05/14/2014  CLINICAL DATA:  54 year old female with choking with liquids and solids. Persistent choking. Initial encounter. EXAM: ESOPHOGRAM / BARIUM SWALLOW / BARIUM TABLET STUDY TECHNIQUE: Combined double contrast and single contrast examination performed using effervescent crystals, thick barium liquid, and thin barium liquid. The patient was observed with fluoroscopy swallowing a 13mm barium sulphate tablet. FLUOROSCOPY TIME:  2 min and 18 seconds. COMPARISON:  None. FINDINGS: A double contrast study was undertaken and the patient tolerated this well and without difficulty. No obstruction to the forward flow of contrast throughout the esophagus and into the stomach. Normal esophageal course and contour. Normal esophageal mucosal pattern. A 12.5 mm barium tablet was administered and passed quickly into the stomach without delay. Rapid sequence imaging of the cervical esophagus is normal. Normal will esophageal motility. Normal gastroesophageal junction except for transient small sliding type hiatal hernia. However, a small volume of spontaneous  gastroesophageal reflux to the distal thoracic esophagus occurred. Gastric emptying noted at the conclusion of this study. IMPRESSION: 1. Spontaneous gastroesophageal reflux to the distal thoracic esophagus 2. Small sliding-type hiatal hernia (probably inconsequential). Electronically Signed   By: Augusto Gamble M.D.   On: 05/14/2014 14:47   Dg Swallowing Func-speech  Pathology  05/14/2014  Chales Abrahams, CCC-SLP     05/14/2014  2:23 PM Objective Swallowing Evaluation: Modified Barium Swallowing Study Patient Details Name: Cassandra Alvarez MRN: [id number] Date of Birth: November 18, 1960 Today's Date: 05/14/2014 Time: 4098-1191 SLP Time Calculation (min): 25 min Past Medical History: Past Medical History Diagnosis Date . UTI (urinary tract infection)  . Pneumonia 1961 . Hypothyroidism  . HSV-2 (herpes simplex virus 2) infection  . Cervical high risk HPV (human papillomavirus) test positive  . Anemia    past . Mononucleosis  Past Surgical History: Past Surgical History Procedure Laterality Date . Tonsilectomy, adenoidectomy, bilateral myringotomy and tubes   . Cryotherapy   . Breast cyst aspiration     left breast aspiration-negative . Bunionectomy  6/13 HPI: 54 yo female referred by Dr Emeline Darling for [facility] and esophagram due to pt complaint of choking while eating.  Pt reports this occurs occasionally over the last five years (approx 5 times) but has occured 3 times in June on liquids.  Pt said she used to be able to break up choking episode with coughing but now feels like her airway is closed off and she can't catch her breath.  Pt denies pulmonary infections, weight loss, significant reflux issues nor requiring heimlich maneuver.  She did states last episodes with liquids occured when pt was "sucking" to bring bolus into mouth - eg: tasting liquid from spoon, drinking from bottle and straw.  She has been taking liquid vitamins and reports haivng a hard time swallowing large or coarse pills.    Assessment / Plan / Recommendation Clinical Impression  Dysphagia Diagnosis: Within Functional Limits Clinical impression: Functional oropharyngeal swallow ability that was timely and strong without laryngeal penetration or aspiration.  SLP did not test barium tablet as she is to be barium swallow immediately after this test.  Pt naturally takes small bites/sips which SLP suspects is compensatory for her  occasional episodes.  Only trace pyriform sinus residual noted with liquids.  Pt did not have choking episode during testing.  Educated pt to findings using video and verbal feedback and advised pt to try to "pant" if issue occurs to take pressure away from larynx.  Also advised she consider practicing abdominal breathing for relaxation as she reports to "feel tight" in throat.    Recommended pt avoid use of straws or consuming in ways that causes her to "inhale" to bring bolus into mouth as previous 3 events with drinks occured with this posture per pt.   Treatment Recommendation    n/a  Diet Recommendation Regular;Thin liquid Liquid Administration via: Cup Medication Administration:  (as tolerated, advised to take large pills with masticated food if eases clearance) Supervision: Patient able to self feed Compensations: Slow rate;Small sips/bites (consume liquids t/o meal) Postural Changes and/or Swallow Maneuvers: Seated upright 90 degrees;Upright 30-60 min after meal  Other  Recommendations Oral Care Recommendations: Oral care BID  Follow Up Recommendations  None      SLP Swallow Goals  General Date of Onset: 05/14/14 HPI: 54 yo female referred by Dr Emeline Darling for MBS and esophagram due to pt complaint of choking while eating.  Pt reports this occurs occasionally over the last  five years (approx 5 times) but has occured 3 times in June on liquids.  Pt said she used to be able to break up choking episode with coughing but now feels like her airway is closed off and she can't catch her breath.  Pt denies pulmonary infections, weight loss, significant reflux issues nor requiring heimlich maneuver.  She did states last episodes with liquids occured when pt was "sucking" to bring bolus into mouth - eg: tasting liquid from spoon, drinking from bottle and straw.  She has been taking liquid vitamins and reports haivng a hard time swallowing large or coarse pills.  Type of Study: Modified Barium Swallowing Study Reason for  Referral: Objectively evaluate swallowing function Diet Prior to this Study: Regular;Thin liquids Temperature Spikes Noted: No Respiratory Status: Room air History of Recent Intubation: No Behavior/Cognition: Alert Oral Cavity - Dentition: Adequate natural dentition Oral Motor / Sensory Function: Within functional limits Self-Feeding Abilities: Able to feed self Patient Positioning: Upright in bed Baseline Vocal Quality: Clear Volitional Cough: Strong Volitional Swallow: Able to elicit Anatomy: Within functional limits Pharyngeal Secretions: Not observed secondary MBS  Reason for Referral Objectively evaluate swallowing function  Oral Phase Oral Preparation/Oral Phase Oral Phase: WFL Oral - Nectar Oral - Nectar Cup: Within functional limits Oral - Thin Oral - Thin Cup: Within functional limits Oral - Thin Straw: Within functional limits Oral - Solids Oral - Puree: Within functional limits Oral - Regular: Within functional limits  Pharyngeal Phase Pharyngeal - Nectar Pharyngeal - Nectar Cup: Within functional limits Pharyngeal - Thin Pharyngeal - Thin Cup: Within functional limits;Pharyngeal residue - pyriform sinuses (trace residuals in pyriform sinus region) Pharyngeal - Thin Straw: Within functional limits Pharyngeal - Solids Pharyngeal - Puree: Within functional limits Pharyngeal - Regular: Within functional limits Cervical Esophageal Phase GO  Cervical Esophageal Phase Cervical Esophageal Phase: WFL (appears with mildly slow clearance distally of thicker consistenies, liquids faciliated clearance - radiologist not present) Cervical Esophageal Phase - Nectar Nectar Cup: Within functional limits Cervical Esophageal Phase - Thin Thin Cup: Within functional limits Thin Straw: Within functional limits Cervical Esophageal Phase - Solids Puree: Within functional limits Regular: Within functional limits Functional Assessment Tool Used: mbs, clinical judgement Functional Limitations: Swallowing Swallow Current Status  (V2536): At least 1 percent but less than 20 percent impaired, limited or restricted Swallow Goal Status [phone]): At least 1 percent but less than 20 percent impaired, limited or restricted Swallow Discharge Status [phone]): At least 1 percent but less than 20 percent impaired, limited or restricted  Mills Koller, MS [facility] SLP [phone]       Assessment & Plan:   Problem List Items Addressed This Visit    None    Visit Diagnoses    Hypothyroidism, unspecified hypothyroidism type    -  Primary    Relevant Orders    TSH        Loreen Freud, DO

## 2015-09-11 NOTE — Assessment & Plan Note (Signed)
con't synthroid 

## 2015-09-11 NOTE — Patient Instructions (Signed)

## 2015-09-11 NOTE — Progress Notes (Signed)
Pre visit review using our clinic review tool, if applicable. No additional management support is needed unless otherwise documented below in the visit note. 

## 2015-10-05 ENCOUNTER — Other Ambulatory Visit: Payer: Self-pay | Admitting: Family Medicine

## 2016-09-11 ENCOUNTER — Other Ambulatory Visit: Payer: Self-pay | Admitting: Family Medicine

## 2016-09-12 NOTE — Telephone Encounter (Signed)
Patient needs annual exam before anymore refills can be dispensed  Thanks PC

## 2016-09-12 NOTE — Telephone Encounter (Signed)
Called pt to make her aware. Pt says that she will call us back if she decides to schedule with Primary due to her insurance change.

## 2016-09-14 ENCOUNTER — Other Ambulatory Visit: Payer: Self-pay | Admitting: Certified Nurse Midwife

## 2016-09-14 MED ORDER — LEVOTHYROXINE SODIUM 88 MCG PO TABS: 88.0000 ug | ORAL_TABLET | Freq: Every day | ORAL | 1 refills | Status: DC

## 2016-09-14 NOTE — Telephone Encounter (Signed)
Patient notified

## 2016-09-14 NOTE — Telephone Encounter (Signed)
Will refill one only she needs TSH at aex

## 2016-09-14 NOTE — Telephone Encounter (Signed)
Medication refill request: synthroid Last AEX:  03/04/13 DL Next AEX: 95/04/2111/1/17 DL Last MMG (if hormonal medication request): 07/19/13 BIRADS1:Neg  Refill authorized: 08/26/11 #30tabs /0R with PCP  Called patient. PCP has been filling Rx for the last couple of years. She states she does not have insurance now and would like to only see us now instead of her PCP to save money. Patient requesting 30 days supplies of her synthroid until appt. Please advise.

## 2016-09-14 NOTE — Telephone Encounter (Signed)
patient scheduled for 10/14/16 for ngyn/aex.  she is requesting a refill on Synthroid but has not been seen in over 3 yrs.  Last seen 03/04/2013

## 2016-10-14 ENCOUNTER — Ambulatory Visit (INDEPENDENT_AMBULATORY_CARE_PROVIDER_SITE_OTHER): Payer: Self-pay | Admitting: Certified Nurse Midwife

## 2016-10-14 ENCOUNTER — Encounter: Payer: Self-pay | Admitting: Certified Nurse Midwife

## 2016-10-14 VITALS — BP 102/68 | HR 68 | Resp 16 | Ht 67.25 in | Wt 123.0 lb

## 2016-10-14 DIAGNOSIS — E039 Hypothyroidism, unspecified: Secondary | ICD-10-CM

## 2016-10-14 DIAGNOSIS — Z124 Encounter for screening for malignant neoplasm of cervix: Secondary | ICD-10-CM

## 2016-10-14 DIAGNOSIS — Z01419 Encounter for gynecological examination (general) (routine) without abnormal findings: Secondary | ICD-10-CM

## 2016-10-14 LAB — TSH: TSH: 1.38 mIU/L

## 2016-10-14 NOTE — Patient Instructions (Signed)

## 2016-10-14 NOTE — Progress Notes (Signed)
55 y.o. [id number] Married  Caucasian Fe here for annual exam. Menopausal, no hot flashes or night sweats. Denies vaginal bleeding. Some vaginal dryness, request help with this. Cassandra Alvarez is now [id number]! Sees Dr. Shearon StallsLowne  Yearly, but has not plan to be seen this year. Desires screening labs. No other health issues today.  Patient's last menstrual period was 07/16/2011.          Sexually active: Yes.    The current method of family planning is vasectomy.    Exercising: No.  exercise Smoker:  no  Health Maintenance: Pap:  2014? Neg, hx of cryo age 55 MMG:  07-18-13 category b density birads 1:neg Colonoscopy:  Age 55 f/u 238yrs BMD:  ?  TDaP:  2014 Shingles: no Pneumonia: no Hep C and HIV: not done Labs: none Self breast exam: done occ   reports that she has quit smoking. She has never used smokeless tobacco. She reports that she drinks about 2.4 - 3.0 oz of alcohol per week . She reports that she does not use drugs.  Past Medical History:  Diagnosis Date  . Abnormal Pap smear of cervix   . Anemia    past, age 55  . Cervical high risk HPV (human papillomavirus) test positive   . Genital warts   . HSV-2 (herpes simplex virus 2) infection   . Hypothyroidism   . Mononucleosis   . Pneumonia 1961  . STD (sexually transmitted disease)    HSV  . UTI (urinary tract infection)     Past Surgical History:  Procedure Laterality Date  . BREAST CYST ASPIRATION     left breast aspiration-negative  . BUNIONECTOMY  6/13  . CRYOTHERAPY     age 55  . TONSILECTOMY, ADENOIDECTOMY, BILATERAL MYRINGOTOMY AND TUBES      Current Outpatient Prescriptions  Medication Sig Dispense Refill  . levothyroxine (SYNTHROID, LEVOTHROID) 88 MCG tablet Take 1 tablet (88 mcg total) by mouth daily. 30 tablet 1  . Multiple Vitamins-Minerals (MULTIVITAMIN) LIQD Take by mouth daily.    . Probiotic Product (PROBIOTIC PO) Take by mouth daily.     No current facility-administered medications for this visit.     Family History   Problem Relation Age of Onset  . Colitis Mother   . Osteoporosis Mother   . Lung cancer Mother   . Rheum arthritis Mother   . Scoliosis Mother   . Autoimmune disease Mother     sjogren, raynauds,external lupus, EM  . Cancer Father     liposarcoma  . Cancer Maternal Grandfather     colon  . Hypertension Maternal Grandfather   . Hypertension Maternal Grandmother   . Hypertension Paternal Grandmother   . Cancer Paternal Grandmother   . Depression Sister     ROS:  Pertinent items are noted in HPI.  Otherwise, a comprehensive ROS was negative.  Exam:   BP 102/68   Pulse 68   Resp 16   Ht 5' 7.25" (1.708 m)   Wt 123 lb (55.8 kg)   LMP 07/16/2011   BMI 19.12 kg/m  Height: 5' 7.25" (170.8 cm) Ht Readings from Last 3 Encounters:  10/14/16 5' 7.25" (1.708 m)  09/11/15 5\' 8"  (1.727 m)  03/04/13 5' 7.75" (1.721 m)    General appearance: alert, cooperative and appears stated age Head: Normocephalic, without obvious abnormality, atraumatic Neck: no adenopathy, supple, symmetrical, trachea midline and thyroid normal to inspection and palpation Lungs: clear to auscultation bilaterally Breasts: normal appearance, no masses or tenderness, No nipple  retraction or dimpling, No nipple discharge or bleeding, No axillary or supraclavicular adenopathy Heart: regular rate and rhythm Abdomen: soft, non-tender; no masses,  no organomegaly Extremities: extremities normal, atraumatic, no cyanosis or edema Skin: Skin color, texture, turgor normal. No rashes or lesions Lymph nodes: Cervical, supraclavicular, and axillary nodes normal. No abnormal inguinal nodes palpated Neurologic: Grossly normal   Pelvic: External genitalia:  no lesions              Urethra:  normal appearing urethra with no masses, tenderness or lesions              Bartholin's and Skene's: normal                 Vagina: normal appearing vagina with normal color and discharge, no lesions              Cervix: multiparous  appearance, no cervical motion tenderness and no lesions              Pap taken: Yes.   Bimanual Exam:  Uterus:  normal size, contour, position, consistency, mobility, non-tender              Adnexa: normal adnexa and no mass, fullness, tenderness               Rectovaginal: Confirms               Anus:  normal sphincter tone, no lesions  Chaperone present: yes  A:  Well Woman with normal exam  Menopausal no HRT  Hypothyroid on stable medication  Screening  labs  P:   Reviewed health and wellness pertinent to exam  Aware of need to evaluate if vaginal bleeding  Patient has enough of thyroid medication until labs are in and will refill or change dosage if indicated  Lab: TSH  Pap smear as above with HPVHR   counseled on breast self exam, mammography screening, menopause, adequate intake of calcium and vitamin D, diet and exercise  return annually or prn  An After Visit Summary was printed and given to the patient.

## 2016-10-17 ENCOUNTER — Other Ambulatory Visit: Payer: Self-pay | Admitting: Family Medicine

## 2016-10-18 ENCOUNTER — Other Ambulatory Visit: Payer: Self-pay | Admitting: Certified Nurse Midwife

## 2016-10-18 DIAGNOSIS — E039 Hypothyroidism, unspecified: Secondary | ICD-10-CM

## 2016-10-18 LAB — IPS PAP TEST WITH HPV

## 2016-10-18 MED ORDER — LEVOTHYROXINE SODIUM 88 MCG PO TABS: 88.0000 ug | ORAL_TABLET | Freq: Every day | ORAL | 12 refills | Status: DC

## 2016-10-21 NOTE — Progress Notes (Signed)
Encounter reviewed Jill Jertson, MD   

## 2016-10-22 ENCOUNTER — Other Ambulatory Visit: Payer: Self-pay | Admitting: Family Medicine

## 2016-11-22 ENCOUNTER — Other Ambulatory Visit: Payer: Self-pay | Admitting: *Deleted

## 2016-11-22 DIAGNOSIS — E039 Hypothyroidism, unspecified: Secondary | ICD-10-CM

## 2016-11-22 MED ORDER — LEVOTHYROXINE SODIUM 88 MCG PO TABS: 88.0000 ug | ORAL_TABLET | Freq: Every day | ORAL | 0 refills | Status: DC

## 2016-11-22 NOTE — Telephone Encounter (Signed)
Pharmacy requesting 90 day supply of levothyroxine.  Rx sent in.

## 2017-02-09 ENCOUNTER — Telehealth: Payer: Self-pay | Admitting: Certified Nurse Midwife

## 2017-02-09 DIAGNOSIS — E039 Hypothyroidism, unspecified: Secondary | ICD-10-CM

## 2017-02-09 MED ORDER — LEVOTHYROXINE SODIUM 88 MCG PO TABS: 88.0000 ug | ORAL_TABLET | Freq: Every day | ORAL | 2 refills | Status: DC

## 2017-02-09 NOTE — Telephone Encounter (Signed)
Patient went to pick up her synthroid prescription at pharmacy on file and was told she had no more refills. Patient had blood done in December 2017 and thought that refills were sent to the pharmacy on file. Patient said that the pharmacy told her they never received a request.

## 2017-02-09 NOTE — Telephone Encounter (Signed)
On 10/14/2016 patient's TSH with our office was 1.38. It was recommended that she stay on Synthroid 88 mcg daily. Results seen below. On 10/18/2016 rx for Synthroid 88 mcg daily #30 12RF was sent to pharmacy on file. Appears this was discontinued by patient's PCP office when she was given a refill by their office for Synthroid at the same dosage amount.   Notes Recorded by Eliezer Bottomavina J Johnson, CMA on 10/18/2016 at 4:44 PM EST Message giving results left on patients voicemail. Release signed [phone] (Mobile) *Preferred* ------  Notes Recorded by Eliezer Bottomavina J Johnson, CMA on 10/18/2016 at 4:31 PM EST 02 recall in 5933yr ------  Notes Recorded by Verner Choleborah S Leonard, CNM on 10/18/2016 at 4:25 PM EST Pap 02 ------  Notes Recorded by Verner Choleborah S Leonard, CNM on 10/18/2016 at 4:25 PM EST Notify TSH is normal, RX renewed no dosage change Pap smear is negative, HPVHR not detected. Atrophy noted  Leota SauersDeborah Leonard CNM okay to send in rx for Synthroid 88 mcg daily #30 8RF?

## 2017-02-09 NOTE — Telephone Encounter (Signed)
yes

## 2017-02-09 NOTE — Telephone Encounter (Signed)
Left detailed message at number provided [phone], okay per ROI. Advised rx for Synthroid 88 mcg daily has been sent to pharmacy on file with refills until next aex. Advised to return call to the office with any further questions or concerns.

## 2017-02-23 ENCOUNTER — Other Ambulatory Visit: Payer: Self-pay | Admitting: Obstetrics & Gynecology

## 2017-02-23 DIAGNOSIS — Z1231 Encounter for screening mammogram for malignant neoplasm of breast: Secondary | ICD-10-CM

## 2017-02-24 NOTE — Telephone Encounter (Signed)
Erroneous encounter

## 2017-03-07 ENCOUNTER — Ambulatory Visit (HOSPITAL_BASED_OUTPATIENT_CLINIC_OR_DEPARTMENT_OTHER)
Admission: RE | Admit: 2017-03-07 | Discharge: 2017-03-07 | Disposition: A | Discharge status: Home / Self Care | Payer: PRIVATE HEALTH INSURANCE | Source: Ambulatory Visit | Attending: Obstetrics & Gynecology | Admitting: Obstetrics & Gynecology

## 2017-03-07 ENCOUNTER — Encounter (HOSPITAL_BASED_OUTPATIENT_CLINIC_OR_DEPARTMENT_OTHER): Payer: Self-pay

## 2017-03-07 DIAGNOSIS — Z1231 Encounter for screening mammogram for malignant neoplasm of breast: Secondary | ICD-10-CM

## 2018-01-08 ENCOUNTER — Other Ambulatory Visit: Payer: Self-pay | Admitting: Certified Nurse Midwife

## 2018-01-08 DIAGNOSIS — E039 Hypothyroidism, unspecified: Secondary | ICD-10-CM

## 2018-01-08 NOTE — Telephone Encounter (Signed)
Medication refill request: Levothyroxine 88mcg #90 Last AEX:  10-14-16 Next AEX: none scheduled Last MMG (if hormonal medication request): 03-07-17 [id number] Refill authorized: Please advise

## 2018-01-09 NOTE — Telephone Encounter (Signed)
She will need aex and have lab done for extended refill. Will refill if she schedules. She sees PCP may have done lab there also.

## 2018-01-10 NOTE — Telephone Encounter (Signed)
Patient thinks she had thyroid studies with PCP. She will check with their office and let us know. Advised still needs AEX. She states she is driving at this time and will have to call back after checking her calendar.

## 2018-01-10 NOTE — Telephone Encounter (Signed)
Spoke with patient and states PCP has not done thyroid labs. Made patient AEX appointment to see Leota SauersDeborah Leonard, CNM 01-31-18 10:00am and will give patient 1 refill on thyroid medication.

## 2018-01-10 NOTE — Telephone Encounter (Signed)
Called patient and left message on voicemail to return my call. 

## 2018-01-31 ENCOUNTER — Ambulatory Visit (INDEPENDENT_AMBULATORY_CARE_PROVIDER_SITE_OTHER): Payer: PRIVATE HEALTH INSURANCE | Admitting: Certified Nurse Midwife

## 2018-01-31 ENCOUNTER — Other Ambulatory Visit: Payer: Self-pay

## 2018-01-31 ENCOUNTER — Encounter: Payer: Self-pay | Admitting: Certified Nurse Midwife

## 2018-01-31 VITALS — BP 112/64 | HR 68 | Resp 16 | Ht 67.25 in | Wt 125.0 lb

## 2018-01-31 DIAGNOSIS — N951 Menopausal and female climacteric states: Secondary | ICD-10-CM | POA: Diagnosis not present

## 2018-01-31 DIAGNOSIS — Z01411 Encounter for gynecological examination (general) (routine) with abnormal findings: Secondary | ICD-10-CM | POA: Diagnosis not present

## 2018-01-31 DIAGNOSIS — E039 Hypothyroidism, unspecified: Secondary | ICD-10-CM

## 2018-01-31 DIAGNOSIS — Z0000 Encounter for general adult medical examination without abnormal findings: Secondary | ICD-10-CM

## 2018-01-31 DIAGNOSIS — A6000 Herpesviral infection of urogenital system, unspecified: Secondary | ICD-10-CM

## 2018-01-31 DIAGNOSIS — N6310 Unspecified lump in the right breast, unspecified quadrant: Secondary | ICD-10-CM | POA: Diagnosis not present

## 2018-01-31 MED ORDER — VALACYCLOVIR HCL 500 MG PO TABS: ORAL_TABLET | ORAL | 12 refills | Status: DC

## 2018-01-31 NOTE — Patient Instructions (Signed)

## 2018-01-31 NOTE — Progress Notes (Signed)
Patient scheduled while in office for Dx right breast MMG and US if needed. Spoke with Alli at [facility], scheduled for 02/05/18 arriving at 9:40am for 10am appt. CPT codes provided for imaging by [facility], recommended patient contact insurer prior to appt to review coverage. Patient verbalizes understanding and is agreeable.

## 2018-01-31 NOTE — Progress Notes (Signed)
57 y.o. G21P1001 Married  Caucasian Fe here for annual exam. Menopausal no HRT. Denies vaginal bleeding. Some vaginal dryness using coconut oil with good success.  Has not noed any Thyroid changes with current dosage. Feeling well. Sees Dr. Laury Axon  yearly, prn.  Spouse now with new company in [place] and happy. Son now [employer] (I delivered him)! Desires screening labs today. No other health issues.  Patient's last menstrual period was 07/16/2011.          Sexually active: Yes.    The current method of family planning is vasectomy.    Exercising: No.  exercise Smoker:  no  Health Maintenance: Pap:  10-14-16 neg HPV HR neg History of Abnormal Pap: yes MMG:  03-07-17 category b density birads 1:neg Self Breast exams: occ Colonoscopy:  Age 7 f/u 3yrs BMD:   2013 TDaP:  2014 Shingles: no Pneumonia: no Hep C and HIV: not done Labs: yes    reports that she has quit smoking. she has never used smokeless tobacco. She reports that she drinks about 4.2 oz of alcohol per week. She reports that she does not use drugs.  Past Medical History:  Diagnosis Date  . Abnormal Pap smear of cervix   . Anemia    past, age 61  . Cervical high risk HPV (human papillomavirus) test positive   . Genital warts   . HSV-2 (herpes simplex virus 2) infection   . Hypothyroidism   . Mononucleosis   . Pneumonia 1961  . STD (sexually transmitted disease)    HSV  . UTI (urinary tract infection)     Past Surgical History:  Procedure Laterality Date  . BREAST CYST ASPIRATION     left breast aspiration-negative  . BUNIONECTOMY  6/13  . CRYOTHERAPY     age 6  . TONSILECTOMY, ADENOIDECTOMY, BILATERAL MYRINGOTOMY AND TUBES      Current Outpatient Medications  Medication Sig Dispense Refill  . levothyroxine (SYNTHROID, LEVOTHROID) 88 MCG tablet TAKE 1 TABLET BY MOUTH ONCE DAILY 30 tablet 0  . Multiple Vitamins-Minerals (MULTIVITAMIN) LIQD Take by mouth daily.    . Probiotic Product  (PROBIOTIC PO) Take by mouth daily.     No current facility-administered medications for this visit.     Family History  Problem Relation Age of Onset  . Colitis Mother   . Osteoporosis Mother   . Lung cancer Mother   . Rheum arthritis Mother   . Scoliosis Mother   . Autoimmune disease Mother        sjogren, raynauds,external lupus, EM  . Cancer Father        liposarcoma  . Cancer Maternal Grandfather        colon  . Hypertension Maternal Grandfather   . Hypertension Maternal Grandmother   . Hypertension Paternal Grandmother   . Cancer Paternal Grandmother   . Depression Sister     ROS:  Pertinent items are noted in HPI.  Otherwise, a comprehensive ROS was negative.  Exam:   BP 112/64   Pulse 68   Resp 16   Ht 5' 7.25" (1.708 m)   Wt 125 lb (56.7 kg)   LMP 07/16/2011   BMI 19.43 kg/m  Height: 5' 7.25" (170.8 cm) Ht Readings from Last 3 Encounters:  01/31/18 5' 7.25" (1.708 m)  10/14/16 5' 7.25" (1.708 m)  09/11/15 5\' 8"  (1.727 m)    General appearance: alert, cooperative and appears stated age Head: Normocephalic, without obvious abnormality, atraumatic Neck: no adenopathy, supple, symmetrical,  trachea midline and thyroid normal to inspection and palpation Lungs: clear to auscultation bilaterally Breasts: normal appearance, no masses or tenderness, No nipple retraction or dimpling, No nipple discharge or bleeding, No axillary or supraclavicular adenopathy, right breast mass at 1 fb below aerola bb to 1cm size, other mass ? cyst noted also, slightly tender to touch Heart: regular rate and rhythm Abdomen: soft, non-tender; no masses,  no organomegaly Extremities: extremities normal, atraumatic, no cyanosis or edema Skin: Skin color, texture, turgor normal. No rashes or lesions Lymph nodes: Cervical, supraclavicular, and axillary nodes normal. No abnormal inguinal nodes palpated Neurologic: Grossly normal   Pelvic: External genitalia:  no lesions, normal female               Urethra:  normal appearing urethra with no masses, tenderness or lesions              Bartholin's and Skene's: normal                 Vagina: normal appearing vagina with normal color and discharge, no lesions              Cervix: multiparous appearance, no cervical motion tenderness and no lesions              Pap taken: No. Bimanual Exam:  Uterus:  normal size, contour, position, consistency, mobility, non-tender              Adnexa: normal adnexa and no mass, fullness, tenderness               Rectovaginal: Confirms               Anus:  normal sphincter tone, no lesions  Chaperone present: yes  A:  Well Woman with normal exam  Menopausal no HRT  Vaginal dryness using coconut oil with good response  Hypothyroid stable on medication  Right breast mass  History of HSV 2 needs update on Valtrex  Screening labs  P:   Reviewed health and wellness pertinent to exam  Discussed need to advise if vaginal bleeding  Continue treating with coconut oil prn and daily if needed, will advise if change  Lab: TSH will refill Rx once labs in . Patient has enough Rx for 2 weeks  Discussed right breast mass finding and need to evaluate with diagnostic mammogram and US. Patient agreeable. Will be scheduled prior to leaving.  Rx Valtrex see order with instructions  Continue follow up with PCP as indicated  Labs: CMP,Lipid panel, Vitamin D,TSH  Pap smear: no   counseled on breast self exam, mammography screening, feminine hygiene, adequate intake of calcium and vitamin D, diet and exercise, Kegel's exercises  return annually or prn  An After Visit Summary was printed and given to the patient.

## 2018-02-01 LAB — COMPREHENSIVE METABOLIC PANEL
ALK PHOS: 52 IU/L (ref 39–117)
ALT: 18 IU/L (ref 0–32)
AST: 18 IU/L (ref 0–40)
Albumin/Globulin Ratio: 2.1 (ref 1.2–2.2)
Albumin: 4.9 g/dL (ref 3.5–5.5)
BUN/Creatinine Ratio: 25 — ABNORMAL HIGH (ref 9–23)
BUN: 21 mg/dL (ref 6–24)
Bilirubin Total: 0.3 mg/dL (ref 0.0–1.2)
CALCIUM: 9.6 mg/dL (ref 8.7–10.2)
CO2: 26 mmol/L (ref 20–29)
CREATININE: 0.84 mg/dL (ref 0.57–1.00)
Chloride: 97 mmol/L (ref 96–106)
GFR calc Af Amer: 89 mL/min/{1.73_m2} (ref >59)
GFR, EST NON AFRICAN AMERICAN: 77 mL/min/{1.73_m2} (ref >59)
GLUCOSE: 79 mg/dL (ref 65–99)
Globulin, Total: 2.3 g/dL (ref 1.5–4.5)
Potassium: 4.1 mmol/L (ref 3.5–5.2)
SODIUM: 139 mmol/L (ref 134–144)
Total Protein: 7.2 g/dL (ref 6.0–8.5)

## 2018-02-01 LAB — LIPID PANEL
CHOL/HDL RATIO: 2.6 ratio (ref 0.0–4.4)
Cholesterol, Total: 193 mg/dL (ref 100–199)
HDL: 74 mg/dL (ref >39)
LDL CALC: 109 mg/dL — AB (ref 0–99)
Triglycerides: 49 mg/dL (ref 0–149)
VLDL CHOLESTEROL CAL: 10 mg/dL (ref 5–40)

## 2018-02-01 LAB — TSH: TSH: 1.88 u[IU]/mL (ref 0.450–4.500)

## 2018-02-01 LAB — VITAMIN D 25 HYDROXY (VIT D DEFICIENCY, FRACTURES): Vit D, 25-Hydroxy: 59.8 ng/mL (ref 30.0–100.0)

## 2018-02-02 ENCOUNTER — Telehealth: Payer: Self-pay

## 2018-02-02 ENCOUNTER — Telehealth: Payer: Self-pay | Admitting: *Deleted

## 2018-02-02 DIAGNOSIS — E039 Hypothyroidism, unspecified: Secondary | ICD-10-CM

## 2018-02-02 MED ORDER — LEVOTHYROXINE SODIUM 88 MCG PO TABS: 88.0000 ug | ORAL_TABLET | Freq: Every day | ORAL | 11 refills | Status: DC

## 2018-02-02 NOTE — Telephone Encounter (Signed)
-----   Message from Patton SallesBrook E Amundson C Silva, MD sent at 02/01/2018  2:41 PM EDT ----- This is Dr. Edward JollySilva reviewing labs while Sara ChuDebbie Leonard is out of the office.  Please report results to patient.  Vit D is 59.8, which is normal.  Thyroid function is normal.  Her cholesterol panel shows a minimally elevated LDL cholesterol, which is the bad cholesterol.  Her good cholesterol called the HDL is high.  Overall her cholesterol ratios overall are good.  Her metabolic profile (blood chemistries) are unremarkable.   Cc- Cornelia CopaJoy Johnson

## 2018-02-02 NOTE — Telephone Encounter (Signed)
Spoke with patient. Results given as seen below from Dr.Silva. Patient verbalizes understanding. States Leota SauersDeborah Leonard CNM was waiting to get TSH results back to determine Levothyroxine dose. Asking for refill of Levothy[facility]ine 88 mcg [facility] [address]  Routing to Dr.Silva for review of refill

## 2018-02-02 NOTE — Telephone Encounter (Signed)
Ok to refill Synthroid 88 mcg daily for one year.

## 2018-02-02 NOTE — Telephone Encounter (Signed)
Spoke with patient. Patient is scheduled for right breast Dx MMG and US, if needed, on 02/05/18 at [facility]. Patient states she has contacted her insurance provider and was advised a PA is needed from provider for imaging.   Patient request return call to [facility] at [phone] to provide further information. Advised patient this type of imaging does not typically require a PA from our office, can have our business office f/u and return call, patient is agreeable.   Routing to [employer] Davis.

## 2018-02-02 NOTE — Telephone Encounter (Signed)
Spoke with patient. Advised rx for Levothyroxine 88 mcg daily #30 11RF has been sent to pharmacy on file. Patient is agreeable. Encounter closed.

## 2018-02-05 ENCOUNTER — Ambulatory Visit
Admission: RE | Admit: 2018-02-05 | Discharge: 2018-02-05 | Disposition: A | Discharge status: Home / Self Care | Payer: PRIVATE HEALTH INSURANCE | Source: Ambulatory Visit | Attending: Certified Nurse Midwife | Admitting: Certified Nurse Midwife

## 2018-02-05 DIAGNOSIS — N6310 Unspecified lump in the right breast, unspecified quadrant: Secondary | ICD-10-CM

## 2018-02-06 NOTE — Telephone Encounter (Signed)
Reviewed with Clotilde Dieterosa, patient was notified of PA approval on 02/02/18, see account notes.   Routing to provider for final review.  Will close encounter.

## 2018-02-07 ENCOUNTER — Telehealth: Payer: Self-pay | Admitting: *Deleted

## 2018-02-07 NOTE — Telephone Encounter (Signed)
-----   Message from Verner Choleborah S Leonard, CNM sent at 02/06/2018  7:50 AM EDT ----- Reviewed diagnostic mammogram and US, fibroglandular tissue with  Minimal thickening noted. Would like to recheck breast prior to her follow up bilateral screening in one month.

## 2018-02-07 NOTE — Telephone Encounter (Signed)
Notes recorded by Leda MinHamm, Cecil Vandyke N, RN on 02/07/2018 at 2:37 PM EDT Spoke with patient, advised as seen below per Leota Sauerseborah Leonard, CNM. Patient declined to schedule breast recheck unless "absolutely necessary". Patient states she "was cleared by radiologist and pays out of pocket for office visits". Advised patient recheck is recommended, advised patient if she returns for screening MMG and present with concerns r/t breast, will be referred back to Leota Sauerseborah Leonard, CNM for evaluation prior to screening. Again, patient declined OV. Advised will update provider and return call with any additional recommendations. Patient verbalizes understanding.   See telephone encounter dated 02/07/18 to review with provider.  Leota Sauerseborah Leonard, CNM -please review and advise of any additional recommendations?

## 2018-02-07 NOTE — Telephone Encounter (Signed)
My plan was to recheck to see if resolved so there would be no question. But if declines, will wait for screening mammogram follow up.

## 2018-02-07 NOTE — Telephone Encounter (Signed)
Left detailed message, name identified on voicemail, ok per dpr. Advised as seen below per Leota Sauerseborah Leonard, CNM. Return call to office to schedule OV or with any additional questions. Will close encounter.

## 2018-10-22 ENCOUNTER — Other Ambulatory Visit: Payer: Self-pay | Admitting: Certified Nurse Midwife

## 2018-10-22 DIAGNOSIS — A6000 Herpesviral infection of urogenital system, unspecified: Secondary | ICD-10-CM

## 2018-10-22 MED ORDER — VALACYCLOVIR HCL 500 MG PO TABS: ORAL_TABLET | ORAL | 0 refills | Status: DC

## 2018-10-22 NOTE — Telephone Encounter (Signed)
Medication refill request: Valtrex Last AEX:  01-31-18 DL  Next AEX: not currently scheduled  Last MMG (if hormonal medication request): n/a Refill authorized: Today, please advise.   Call to patient. Patient states that she was unaware a year supply of valtrex [facility] [place] [facility] [place]. Patient states she is currently at [facility] her mother who just had emergency surgery. Asking if she can get a one month supply of valtrex called in to [facility] at [place] so she doesn't have to travel to [place]. States she will f[facility]u with [facility] once her mother gets out of the hospital. Patient requests a call back once RX sent.

## 2018-10-22 NOTE — Telephone Encounter (Signed)
Patient is asking for a refill of valacyclovir to the pharmacy on file.

## 2018-10-23 NOTE — Telephone Encounter (Signed)
Left voicemail for patient re: Rx sent to pharmacy.

## 2019-02-18 ENCOUNTER — Other Ambulatory Visit: Payer: Self-pay | Admitting: Obstetrics and Gynecology

## 2019-02-18 DIAGNOSIS — E039 Hypothyroidism, unspecified: Secondary | ICD-10-CM

## 2019-02-18 NOTE — Telephone Encounter (Signed)
Medication refill request: Levothyroxine Last AEX:  01/31/18 DL Next AEX: none scheduled Last MMG (if hormonal medication request): 02/05/18 Diagnostic Right Breast MM/US -- BIRADS 1 negative/density b Refill authorized: Please advise; Order pended for #90 w/0 refills

## 2019-03-26 ENCOUNTER — Other Ambulatory Visit (HOSPITAL_BASED_OUTPATIENT_CLINIC_OR_DEPARTMENT_OTHER): Payer: Self-pay | Admitting: Family Medicine

## 2019-03-26 ENCOUNTER — Other Ambulatory Visit (HOSPITAL_BASED_OUTPATIENT_CLINIC_OR_DEPARTMENT_OTHER): Payer: Self-pay | Admitting: Certified Nurse Midwife

## 2019-03-26 DIAGNOSIS — Z1231 Encounter for screening mammogram for malignant neoplasm of breast: Secondary | ICD-10-CM

## 2019-04-09 ENCOUNTER — Other Ambulatory Visit: Payer: Self-pay

## 2019-04-09 ENCOUNTER — Ambulatory Visit (HOSPITAL_BASED_OUTPATIENT_CLINIC_OR_DEPARTMENT_OTHER)
Admission: RE | Admit: 2019-04-09 | Discharge: 2019-04-09 | Disposition: A | Discharge status: Home / Self Care | Payer: PRIVATE HEALTH INSURANCE | Source: Ambulatory Visit | Attending: Certified Nurse Midwife | Admitting: Certified Nurse Midwife

## 2019-04-09 DIAGNOSIS — Z1231 Encounter for screening mammogram for malignant neoplasm of breast: Secondary | ICD-10-CM | POA: Insufficient documentation

## 2019-06-13 ENCOUNTER — Other Ambulatory Visit: Payer: Self-pay | Admitting: Obstetrics and Gynecology

## 2019-06-13 DIAGNOSIS — E039 Hypothyroidism, unspecified: Secondary | ICD-10-CM

## 2019-06-13 NOTE — Telephone Encounter (Signed)
Medication refill request: levothyroxine  Last AEX:  01/31/18 Next AEX:  Nothing Scheduled  Last MMG (if hormonal medication request): 04/09/19 Bi-rads 1 neg  Refill authorized: #90 with 0 rf pended for today.

## 2019-06-20 ENCOUNTER — Other Ambulatory Visit: Payer: Self-pay | Admitting: Family Medicine

## 2019-06-20 ENCOUNTER — Telehealth: Payer: Self-pay | Admitting: Family Medicine

## 2019-06-20 ENCOUNTER — Encounter: Payer: PRIVATE HEALTH INSURANCE | Admitting: Family Medicine

## 2019-06-20 ENCOUNTER — Other Ambulatory Visit: Payer: Self-pay

## 2019-06-20 DIAGNOSIS — E039 Hypothyroidism, unspecified: Secondary | ICD-10-CM

## 2019-06-20 MED ORDER — LEVOTHYROXINE SODIUM 88 MCG PO TABS: 88.0000 ug | ORAL_TABLET | Freq: Every day | ORAL | 0 refills | Status: DC

## 2019-06-20 NOTE — Telephone Encounter (Signed)
Pt is needing refill soon for levothyroxine (SYNTHROID, LEVOTHROID) 88 MCG tablet send to [facility], [place], [place] Tel [phone]. Please advise.

## 2019-06-20 NOTE — Telephone Encounter (Signed)
Refill sent.

## 2019-07-23 ENCOUNTER — Ambulatory Visit (INDEPENDENT_AMBULATORY_CARE_PROVIDER_SITE_OTHER): Payer: PRIVATE HEALTH INSURANCE | Admitting: Family Medicine

## 2019-07-23 ENCOUNTER — Other Ambulatory Visit: Payer: Self-pay

## 2019-07-23 ENCOUNTER — Encounter: Payer: Self-pay | Admitting: Family Medicine

## 2019-07-23 VITALS — BP 100/70 | HR 75 | Temp 98.3°F | Resp 18 | Ht 67.5 in | Wt 133.2 lb

## 2019-07-23 DIAGNOSIS — Z0000 Encounter for general adult medical examination without abnormal findings: Secondary | ICD-10-CM | POA: Diagnosis not present

## 2019-07-23 DIAGNOSIS — E2839 Other primary ovarian failure: Secondary | ICD-10-CM

## 2019-07-23 NOTE — Progress Notes (Signed)
Subjective:     Cassandra Alvarez is a 58 y.o. female and is here for a comprehensive physical exam. The patient reports no problems.  Social History   Socioeconomic History  . Marital status: Married    Spouse name: Not on file  . Number of children: Not on file  . Years of education: Not on file  . Highest education level: Not on file  Occupational History  . Not on file  Social Needs  . Financial resource strain: Not on file  . Food insecurity    Worry: Not on file    Inability: Not on file  . Transportation needs    Medical: Not on file    Non-medical: Not on file  Tobacco Use  . Smoking status: Former Games developermoker  . Smokeless tobacco: Never Used  Substance and Sexual Activity  . Alcohol use: Yes    Alcohol/week: 7.0 standard drinks    Types: 7 Standard drinks or equivalent per week  . Drug use: No  . Sexual activity: Yes    Partners: Male    Comment: husband vasectomy  Lifestyle  . Physical activity    Days per week: Not on file    Minutes per session: Not on file  . Stress: Not on file  Relationships  . Social Musicianconnections    Talks on phone: Not on file    Gets together: Not on file    Attends religious service: Not on file    Active member of club or organization: Not on file    Attends meetings of clubs or organizations: Not on file    Relationship status: Not on file  . Intimate partner violence    Fear of current or ex partner: Not on file    Emotionally abused: Not on file    Physically abused: Not on file    Forced sexual activity: Not on file  Other Topics Concern  . Not on file  Social History Narrative  . Not on file   Health Maintenance  Topic Date Due  . Hepatitis C Screening  [birth year]  . HIV Screening  07/17/1975  . COLONOSCOPY  07/16/2010  . PAP SMEAR-Modifier  10/15/2019  . MAMMOGRAM  04/08/2021  . TETANUS/TDAP  03/05/2023  . INFLUENZA VACCINE  Completed    The following portions of the patient's history were reviewed and updated as  appropriate:  She  has a past medical history of Abnormal Pap smear of cervix, Anemia, Cervical high risk HPV (human papillomavirus) test positive, Genital warts, HSV-2 (herpes simplex virus 2) infection, Hypothyroidism, Mononucleosis, Osteoarthritis, Pneumonia (1961), STD (sexually transmitted disease), and UTI (urinary tract infection). She does not have any pertinent problems on file. She  has a past surgical history that includes Tonsilectomy, adenoidectomy, bilateral myringotomy and tubes; Cryotherapy; Breast cyst aspiration; and Bunionectomy (6/13). Her family history includes Autoimmune disease in her mother; Cancer in her father, maternal grandfather, and paternal grandmother; Colitis in her mother; Depression in her sister; Hypertension in her maternal grandfather, maternal grandmother, and paternal grandmother; Lung cancer in her mother; Osteoporosis in her mother; Rheum arthritis in her mother; Scoliosis in her mother. She  reports that she has quit smoking. She has never used smokeless tobacco. She reports current alcohol use of about 7.0 standard drinks of alcohol per week. She reports that she does not use drugs. She has a current medication list which includes the following prescription(s): levothyroxine, multivitamin, probiotic product, and valacyclovir. Current Outpatient Medications on File Prior to Visit  Medication Sig Dispense Refill  . levothyroxine (SYNTHROID) 88 MCG tablet Take 1 tablet (88 mcg total) by mouth daily. 90 tablet 0  . Multiple Vitamins-Minerals (MULTIVITAMIN) LIQD Take by mouth daily.    . Probiotic Product (PROBIOTIC PO) Take by mouth daily.    . valACYclovir (VALTREX) 500 MG tablet Take one tablet twice daily for 3 days at onset of outbreak 30 tablet 0   No current facility-administered medications on file prior to visit.    She has No Known Allergies..  Review of Systems Review of Systems  Constitutional: Negative for activity change, appetite change and  fatigue.  HENT: Negative for hearing loss, congestion, tinnitus and ear discharge.  dentist q20m Eyes: Negative for visual disturbance (see optho q1y -- vision corrected to 20/20 with glasses).  Respiratory: Negative for cough, chest tightness and shortness of breath.   Cardiovascular: Negative for chest pain, palpitations and leg swelling.  Gastrointestinal: Negative for abdominal pain, diarrhea, constipation and abdominal distention.  Genitourinary: Negative for urgency, frequency, decreased urine volume and difficulty urinating.  Musculoskeletal: Negative for back pain, arthralgias and gait problem.  Skin: Negative for color change, pallor and rash.  Neurological: Negative for dizziness, light-headedness, numbness and headaches.  Hematological: Negative for adenopathy. Does not bruise/bleed easily.  Psychiatric/Behavioral: Negative for suicidal ideas, confusion, sleep disturbance, self-injury, dysphoric mood, decreased concentration and agitation.        Objective:    BP 100/70 (BP Location: Left Arm, Patient Position: Sitting, Cuff Size: Normal)   Pulse 75   Temp 98.3 F (36.8 C) (Temporal)   Resp 18   Ht 5' 7.5" (1.715 m)   Wt 133 lb 3.2 oz (60.4 kg)   LMP 07/16/2011   SpO2 98%   BMI 20.55 kg/m  General appearance: alert, cooperative, appears stated age and no distress Head: Normocephalic, without obvious abnormality, atraumatic Eyes: negative findings: lids and lashes normal, conjunctivae and sclerae normal and pupils equal, round, reactive to light and accomodation Ears: normal TM's and external ear canals both ears Nose: Nares normal. Septum midline. Mucosa normal. No drainage or sinus tenderness. Throat: lips, mucosa, and tongue normal; teeth and gums normal Neck: no adenopathy, no carotid bruit, no JVD, supple, symmetrical, trachea midline and thyroid not enlarged, symmetric, no tenderness/mass/nodules Back: symmetric, no curvature. ROM normal. No CVA tenderness. Lungs:  clear to auscultation bilaterally Breasts: gyn Heart: regular rate and rhythm, S1, S2 normal, no murmur, click, rub or gallop Abdomen: soft, non-tender; bowel sounds normal; no masses,  no organomegaly Pelvic: deferred--gyn Extremities: extremities normal, atraumatic, no cyanosis or edema Pulses: 2+ and symmetric Skin: Skin color, texture, turgor normal. No rashes or lesions Lymph nodes: Cervical, supraclavicular, and axillary nodes normal. Neurologic: Alert and oriented X 3, normal strength and tone. Normal symmetric reflexes. Normal coordination and gait    Assessment:    Healthy female exam.      Plan:     ghm utd Check labs See After Visit Summary for Counseling Recommendations    1. Estrogen deficiency  - DG Bone Density; Future - Vitamin D (25 hydroxy)  2. Preventative health care See above - TSH - Lipid panel - CBC with Differential/Platelet - Comprehensive metabolic panel - Vitamin D (25 hydroxy)

## 2019-07-23 NOTE — Patient Instructions (Signed)

## 2019-07-24 LAB — CBC WITH DIFFERENTIAL/PLATELET
Basophils Absolute: 0 10*3/uL (ref 0.0–0.1)
Basophils Relative: 0.7 % (ref 0.0–3.0)
Eosinophils Absolute: 0.1 10*3/uL (ref 0.0–0.7)
Eosinophils Relative: 0.9 % (ref 0.0–5.0)
HCT: 37.7 % (ref 36.0–46.0)
Hemoglobin: 12.5 g/dL (ref 12.0–15.0)
Lymphocytes Relative: 33.9 % (ref 12.0–46.0)
Lymphs Abs: 2.1 10*3/uL (ref 0.7–4.0)
MCHC: 33.2 g/dL (ref 30.0–36.0)
MCV: 90.6 fl (ref 78.0–100.0)
Monocytes Absolute: 0.7 10*3/uL (ref 0.1–1.0)
Monocytes Relative: 10.4 % (ref 3.0–12.0)
Neutro Abs: 3.4 10*3/uL (ref 1.4–7.7)
Neutrophils Relative %: 54.1 % (ref 43.0–77.0)
Platelets: 312 10*3/uL (ref 150.0–400.0)
RBC: 4.16 Mil/uL (ref 3.87–5.11)
RDW: 14.3 % (ref 11.5–15.5)
WBC: 6.3 10*3/uL (ref 4.0–10.5)

## 2019-07-24 LAB — COMPREHENSIVE METABOLIC PANEL
ALT: 14 U/L (ref 0–35)
AST: 19 U/L (ref 0–37)
Albumin: 4.5 g/dL (ref 3.5–5.2)
Alkaline Phosphatase: 46 U/L (ref 39–117)
BUN: 27 mg/dL — ABNORMAL HIGH (ref 6–23)
CO2: 31 mEq/L (ref 19–32)
Calcium: 9.4 mg/dL (ref 8.4–10.5)
Chloride: 98 mEq/L (ref 96–112)
Creatinine, Ser: 0.74 mg/dL (ref 0.40–1.20)
GFR: 80.32 mL/min (ref >60.00)
Glucose, Bld: 82 mg/dL (ref 70–99)
Potassium: 4 mEq/L (ref 3.5–5.1)
Sodium: 136 mEq/L (ref 135–145)
Total Bilirubin: 0.4 mg/dL (ref 0.2–1.2)
Total Protein: 7.1 g/dL (ref 6.0–8.3)

## 2019-07-24 LAB — LIPID PANEL
Cholesterol: 181 mg/dL (ref 0–200)
HDL: 72 mg/dL (ref >39.00)
LDL Cholesterol: 92 mg/dL (ref 0–99)
NonHDL: 108.89
Total CHOL/HDL Ratio: 3
Triglycerides: 86 mg/dL (ref 0.0–149.0)
VLDL: 17.2 mg/dL (ref 0.0–40.0)

## 2019-07-24 LAB — VITAMIN D 25 HYDROXY (VIT D DEFICIENCY, FRACTURES): VITD: 47.84 ng/mL (ref 30.00–100.00)

## 2019-07-24 LAB — TSH: TSH: 1.14 u[IU]/mL (ref 0.35–4.50)

## 2019-07-31 ENCOUNTER — Other Ambulatory Visit: Payer: Self-pay

## 2019-07-31 ENCOUNTER — Ambulatory Visit (HOSPITAL_BASED_OUTPATIENT_CLINIC_OR_DEPARTMENT_OTHER)
Admission: RE | Admit: 2019-07-31 | Discharge: 2019-07-31 | Disposition: A | Discharge status: Home / Self Care | Payer: PRIVATE HEALTH INSURANCE | Source: Ambulatory Visit | Attending: Family Medicine | Admitting: Family Medicine

## 2019-07-31 DIAGNOSIS — E2839 Other primary ovarian failure: Secondary | ICD-10-CM | POA: Insufficient documentation

## 2019-08-01 ENCOUNTER — Other Ambulatory Visit: Payer: Self-pay | Admitting: *Deleted

## 2019-08-01 DIAGNOSIS — E039 Hypothyroidism, unspecified: Secondary | ICD-10-CM

## 2019-08-01 MED ORDER — LEVOTHYROXINE SODIUM 88 MCG PO TABS: 88.0000 ug | ORAL_TABLET | Freq: Every day | ORAL | 1 refills | Status: DC

## 2019-10-15 ENCOUNTER — Encounter: Payer: Self-pay | Admitting: Family Medicine

## 2019-10-15 ENCOUNTER — Other Ambulatory Visit: Payer: Self-pay

## 2019-10-15 ENCOUNTER — Ambulatory Visit (HOSPITAL_BASED_OUTPATIENT_CLINIC_OR_DEPARTMENT_OTHER)
Admission: RE | Admit: 2019-10-15 | Discharge: 2019-10-15 | Disposition: A | Discharge status: Home / Self Care | Payer: PRIVATE HEALTH INSURANCE | Source: Ambulatory Visit | Attending: Family Medicine | Admitting: Family Medicine

## 2019-10-15 ENCOUNTER — Ambulatory Visit (INDEPENDENT_AMBULATORY_CARE_PROVIDER_SITE_OTHER): Payer: PRIVATE HEALTH INSURANCE | Admitting: Family Medicine

## 2019-10-15 VITALS — BP 110/60 | HR 101 | Temp 97.1°F | Resp 18 | Ht 67.5 in | Wt 136.0 lb

## 2019-10-15 DIAGNOSIS — M25561 Pain in right knee: Secondary | ICD-10-CM

## 2019-10-15 NOTE — Patient Instructions (Signed)
Acute Knee Pain, Adult °Acute knee pain is sudden and may be caused by damage, swelling, or irritation of the muscles and tissues that support your knee. The injury may result from: °· A fall. °· An injury to your knee from twisting motions. °· A hit to the knee. °· Infection. °Acute knee pain may go away on its own with time and rest. If it does not, your health care provider may order tests to find the cause of the pain. These may include: °· Imaging tests, such as an X-ray, MRI, or ultrasound. °· Joint aspiration. In this test, fluid is removed from the knee. °· Arthroscopy. In this test, a lighted tube is inserted into the knee and an image is projected onto a TV screen. °· Biopsy. In this test, a sample of tissue is removed from the body and studied under a microscope. °Follow these instructions at home: °Pay attention to any changes in your symptoms. Take these actions to relieve your pain. °If you have a knee sleeve or brace: ° °· Wear the sleeve or brace as told by your health care provider. Remove it only as told by your health care provider. °· Loosen the sleeve or brace if your toes tingle, become numb, or turn cold and blue. °· Keep the sleeve or brace clean. °· If the sleeve or brace is not waterproof: °? Do not let it get wet. °? Cover it with a watertight covering when you take a bath or shower. °Activity °· Rest your knee. °· Do not do things that cause pain or make pain worse. °· Avoid high-impact activities or exercises, such as running, jumping rope, or doing jumping jacks. °· Work with a physical therapist to make a safe exercise program, as recommended by your health care provider. Do exercises as told by your physical therapist. °Managing pain, stiffness, and swelling ° °· If directed, put ice on the knee: °? Put ice in a plastic bag. °? Place a towel between your skin and the bag. °? Leave the ice on for 20 minutes, 2-3 times a day. °· If directed, use an elastic bandage to put pressure  (compression) on your injured knee. This may control swelling, give support, and help with discomfort. °General instructions °· Take over-the-counter and prescription medicines only as told by your health care provider. °· Raise (elevate) your knee above the level of your heart when you are sitting or lying down. °· Sleep with a pillow under your knee. °· Do not use any products that contain nicotine or tobacco, such as cigarettes, e-cigarettes, and chewing tobacco. These can delay healing. If you need help quitting, ask your health care provider. °· If you are overweight, work with your health care provider and a dietitian to set a weight-loss goal that is healthy and reasonable for you. Extra weight can put pressure on your knee. °· Keep all follow-up visits as told by your health care provider. This is important. °Contact a health care provider if: °· Your knee pain continues, changes, or gets worse. °· You have a fever along with knee pain. °· Your knee feels warm to the touch. °· Your knee buckles or locks up. °Get help right away if: °· Your knee swells, and the swelling becomes worse. °· You cannot move your knee. °· You have severe pain in your knee. °Summary °· Acute knee pain can be caused by a fall, an injury, an infection, or damage, swelling, or irritation of the tissues that support your knee. °·   Your health care provider may perform tests to find out the cause of the pain. °· Pay attention to any changes in your symptoms. Relieve your pain with rest, medicines, light activity, and use of ice. °· Get help if your pain continues or becomes worse, your knee swells, or you cannot move your knee. °This information is not intended to replace advice given to you by your health care provider. Make sure you discuss any questions you have with your health care provider. °Document Released: 08/28/2007 Document Revised: 04/12/2018 Document Reviewed: 04/12/2018 °Elsevier Patient Education © 2020 [employer] ° °

## 2019-10-15 NOTE — Progress Notes (Signed)
Patient ID: Cassandra Alvarez, female    DOB: [birth year]  Age: 58 y.o. MRN: [id number]    Subjective:  Subjective  HPI Cassandra Alvarez presents for R knee pain since hiking in mid Oct.  Pt c/o pain esp with stairs and pain comes and goes     Review of Systems  Constitutional: Negative for appetite change, diaphoresis, fatigue and unexpected weight change.  Eyes: Negative for pain, redness and visual disturbance.  Respiratory: Negative for cough, chest tightness, shortness of breath and wheezing.   Cardiovascular: Negative for chest pain, palpitations and leg swelling.  Endocrine: Negative for cold intolerance, heat intolerance, polydipsia, polyphagia and polyuria.  Genitourinary: Negative for difficulty urinating, dysuria and frequency.  Musculoskeletal: Positive for arthralgias, gait problem and joint swelling.  Neurological: Negative for dizziness, light-headedness, numbness and headaches.    History Past Medical History:  Diagnosis Date   Abnormal Pap smear of cervix    Anemia    past, age 20   Cervical high risk HPV (human papillomavirus) test positive    Genital warts    HSV-2 (herpes simplex virus 2) infection    Hypothyroidism    Mononucleosis    Osteoarthritis    Pneumonia 1961   STD (sexually transmitted disease)    HSV   UTI (urinary tract infection)     Cassandra Alvarez has a past surgical history that includes Tonsilectomy, adenoidectomy, bilateral myringotomy and tubes; Cryotherapy; Breast cyst aspiration; and Bunionectomy (6/13).   Her family history includes Autoimmune disease in her mother; Cancer in her father, maternal grandfather, and paternal grandmother; Colitis in her mother; Depression in her sister; Hypertension in her maternal grandfather, maternal grandmother, and paternal grandmother; Lung cancer in her mother; Osteoporosis in her mother; Rheum arthritis in her mother; Scoliosis in her mother.Cassandra Alvarez reports that Cassandra Alvarez has quit smoking. Cassandra Alvarez has never used smokeless  tobacco. Cassandra Alvarez reports current alcohol use of about 7.0 standard drinks of alcohol per week. Cassandra Alvarez reports that Cassandra Alvarez does not use drugs.  Current Outpatient Medications on File Prior to Visit  Medication Sig Dispense Refill   levothyroxine (SYNTHROID) 88 MCG tablet Take 1 tablet (88 mcg total) by mouth daily. 90 tablet 1   Multiple Vitamins-Minerals (MULTIVITAMIN) LIQD Take by mouth daily.     Probiotic Product (PROBIOTIC PO) Take by mouth daily.     valACYclovir (VALTREX) 500 MG tablet Take one tablet twice daily for 3 days at onset of outbreak 30 tablet 0   No current facility-administered medications on file prior to visit.      Objective:  Objective  Physical Exam Vitals signs and nursing note reviewed.  Musculoskeletal:        General: Swelling and tenderness present. No deformity.     Right knee: Cassandra Alvarez exhibits swelling. Tenderness found. Medial joint line tenderness noted.    BP 110/60 (BP Location: Right Arm, Patient Position: Sitting, Cuff Size: Normal)    Pulse (!) 101    Temp (!) 97.1 F (36.2 C) (Temporal)    Resp 18    Ht 5' 7.5" (1.715 m)    Wt 136 lb (61.7 kg)    LMP 07/16/2011    SpO2 98%    BMI 20.99 kg/m  Wt Readings from Last 3 Encounters:  10/15/19 136 lb (61.7 kg)  07/23/19 133 lb 3.2 oz (60.4 kg)  01/31/18 125 lb (56.7 kg)     Lab Results  Component Value Date   WBC 6.3 07/23/2019   HGB 12.5 07/23/2019   HCT 37.7 07/23/2019  PLT 312.0 07/23/2019   GLUCOSE 82 07/23/2019   CHOL 181 07/23/2019   TRIG 86.0 07/23/2019   HDL 72.00 07/23/2019   LDLCALC 92 07/23/2019   ALT 14 07/23/2019   AST 19 07/23/2019   NA 136 07/23/2019   K 4.0 07/23/2019   CL 98 07/23/2019   CREATININE 0.74 07/23/2019   BUN 27 (H) 07/23/2019   CO2 31 07/23/2019   TSH 1.14 07/23/2019    Dg Bone Density  Result Date: 07/31/2019 EXAM: DUAL X-RAY ABSORPTIOMETRY (DXA) FOR BONE MINERAL DENSITY IMPRESSION: [place] [facility] Your patient Cassandra Alvarez completed a BMD test on  07/31/2019 using the Lunar IDXA DXA System (analysis version: 16.SP2) manufactured by [employer]. The following summarizes the results of our evaluation. PATIENT: Name: Cassandra Alvarez, Cassandra Alvarez Patient ID: [id number] Birth Date: 1961/07/28 Height: 67.5 in. Gender: Female Measured: 07/31/2019 Weight: 131.8 lbs. Indications: Caucasian, Estrogen Deficiency, Hypothyroidism, Post Menopausal Fractures: Treatments: Multivitamin, Synthroid ASSESSMENT: The BMD measured at Femur Neck Left is 0.903 g/cm2 with a T-score of -1.0. This patient is considered normal according to World Health Organization [facility]) criteria. The scan quality was good. Site Region Measured Date Measured Age WHO YA BMD Classification T-score AP Spine L1-L4 07/31/2019 59.0 years Normal 0.7 1.261 g/cm2 DualFemur Neck Left 07/31/2019 [age] Normal -1.0 0.903 g/cm2 World Health Organization [facility]) criteria for post-menopausal, Caucasian Women: Normal       T-score at or above -1 SD Osteopenia   T-score between -1 and -2.5 SD Osteoporosis T-score at or below -2.5 SD RECOMMENDATION: 1. All patients should optimize calcium and vitamin D intake. 2. Consider FDA-approved medical therapies in postmenopausal women and men aged 59 years and older, based on the following: a. A hip or vertebral(clinical or morphometric) fracture. b. T-Score < -2.5 at the femoral neck or spine after appropriate evaluation to exclude secondary causes c. Low bone mass (T-score between -1.0 and -2.5 at the femoral neck or spine) and a 10 year probability of a hip fracture >3% or a 10 year probability of major osteoporosis-related fracture > 20% based on the [place]-adapted WHO algorithm d. Clinical judgement and/or patient preferences may indicate treatment for people with 10-year fracture probabilities above or below these levels FOLLOW-UP: Patients with diagnosis of osteoporosis or at high risk for fracture should have regular bone mineral density tests. For patients eligible for Medicare, routine  testing is allowed once every 2 years. The testing frequency can be increased to one year for patients who have rapidly progressing disease, those who are receiving or discontinuing medical therapy to restore bone mass, or have additional risk factors. I have reviewed this report and agree with the above findings. [facility]iology Electronically Signed   By: Elberta Fortis M.D.   On: 07/31/2019 12:21     Assessment & Plan:  Plan  I am having Cassandra Alvarez maintain her Multivitamin, Probiotic Product (PROBIOTIC PO), valACYclovir, and levothyroxine.  No orders of the defined types were placed in this encounter.   Problem List Items Addressed This Visit    None    Visit Diagnoses    Acute pain of right knee    -  Primary   Relevant Orders   DG Knee Complete 4 Views Right    con't with knee sleeve IB or tylenol prn Xray  Consider ortho if no improvement  Follow-up: Return if symptoms worsen or fail to improve.  Donato Schultz, DO

## 2019-10-16 ENCOUNTER — Encounter: Payer: Self-pay | Admitting: Family Medicine

## 2019-10-16 ENCOUNTER — Ambulatory Visit: Payer: PRIVATE HEALTH INSURANCE | Admitting: Family Medicine

## 2019-10-17 ENCOUNTER — Other Ambulatory Visit: Payer: Self-pay | Admitting: Family Medicine

## 2019-10-17 DIAGNOSIS — G8929 Other chronic pain: Secondary | ICD-10-CM

## 2019-10-17 DIAGNOSIS — M25561 Pain in right knee: Secondary | ICD-10-CM

## 2019-10-17 NOTE — Telephone Encounter (Signed)
Referral has been placed. 

## 2019-10-29 ENCOUNTER — Other Ambulatory Visit: Payer: Self-pay | Admitting: Obstetrics & Gynecology

## 2019-10-29 DIAGNOSIS — A6000 Herpesviral infection of urogenital system, unspecified: Secondary | ICD-10-CM

## 2019-10-29 NOTE — Telephone Encounter (Signed)
Med refill request: Valtrex Last AEX: 01/31/2018 Next AEX: 05/19/2020 with DL Last MMG (if hormonal med) n/a Refill authorized: #30, 0RF, orders pended if approved

## 2019-12-03 ENCOUNTER — Telehealth: Payer: Self-pay

## 2019-12-03 NOTE — Telephone Encounter (Signed)
Spoke to pharmacist Joselyn Glassman and gave verbal consent for manufacture change    Copied from CRM [id number]. Topic: General - Inquiry >> Dec 03, 2019 10:59 AM Floria Raveling A wrote: Reason for CRM: [facility] in [place] called in and they are needing approvl to change to a different manufactor for the levothyroxine (SYNTHROID) 88 MCG tablet [[id number]]  Best number [phone]

## 2019-12-10 ENCOUNTER — Encounter: Payer: Self-pay | Admitting: Family Medicine

## 2019-12-10 NOTE — Telephone Encounter (Signed)
Called patient at 5:19 pm for Dr. Zola Button per provider's request to see if we could get the pt on the phone so provider could speak to her regarding the MyChart message pt had sent provider earlier.  Pt did not answer her phone and I left a detailed msg for the pt for reason of the call this late in the day and told her she could reach the office back at 8 am in the morning.

## 2019-12-10 NOTE — Telephone Encounter (Signed)
Would you please call her--- thanks

## 2019-12-11 ENCOUNTER — Other Ambulatory Visit: Payer: Self-pay

## 2019-12-11 ENCOUNTER — Encounter: Payer: Self-pay | Admitting: Family Medicine

## 2019-12-11 ENCOUNTER — Ambulatory Visit (INDEPENDENT_AMBULATORY_CARE_PROVIDER_SITE_OTHER): Payer: PRIVATE HEALTH INSURANCE | Admitting: Family Medicine

## 2019-12-11 VITALS — Temp 97.6°F | Ht 67.5 in | Wt 132.0 lb

## 2019-12-11 DIAGNOSIS — M79604 Pain in right leg: Secondary | ICD-10-CM | POA: Diagnosis not present

## 2019-12-11 NOTE — Progress Notes (Signed)
Virtual Visit via Video Note  I connected with Cassandra Alvarez on 12/11/19 at  9:20 AM EST by a video enabled telemedicine application and verified that I am speaking with the correct person using two identifiers.  Location: Patient: home alone  Provider: home    I discussed the limitations of evaluation and management by telemedicine and the availability of in person appointments. The patient expressed understanding and agreed to proceed.  History of Present Illness: Pt is home c/o con't R low ext -- lateral pain with palpation and walking   no swelling or redness  Observations/Objective: Vitals:   12/11/19 0929  Temp: 97.6 F (36.4 C)   Pt sent pictures in my chart--- tenderness lat low ext  No swelling   Assessment and Plan: 1. Pain of right lower extremity US and ortho referral pending per pt request  - US Venous Img Lower Unilateral Right (DVT); Future  Follow Up Instructions:    I discussed the assessment and treatment plan with the patient. The patient was provided an opportunity to ask questions and all were answered. The patient agreed with the plan and demonstrated an understanding of the instructions.   The patient was advised to call back or seek an in-person evaluation if the symptoms worsen or if the condition fails to improve as anticipated.  I provided 20 minutes of non-face-to-face time during this encounter.   Donato Schultz, DO

## 2019-12-12 ENCOUNTER — Ambulatory Visit (HOSPITAL_BASED_OUTPATIENT_CLINIC_OR_DEPARTMENT_OTHER): Payer: PRIVATE HEALTH INSURANCE

## 2020-02-03 ENCOUNTER — Encounter: Payer: Self-pay | Admitting: Certified Nurse Midwife

## 2020-05-05 IMAGING — CR DG KNEE COMPLETE 4+V*R*
4 series · 4 of 4 positions shown · non-contrast
Comparison: None.

CLINICAL DATA: Right knee pain for 1 month after hiking.
Progressive pain.

EXAM:
RIGHT KNEE - COMPLETE 4+ VIEW

[t knee ap right]
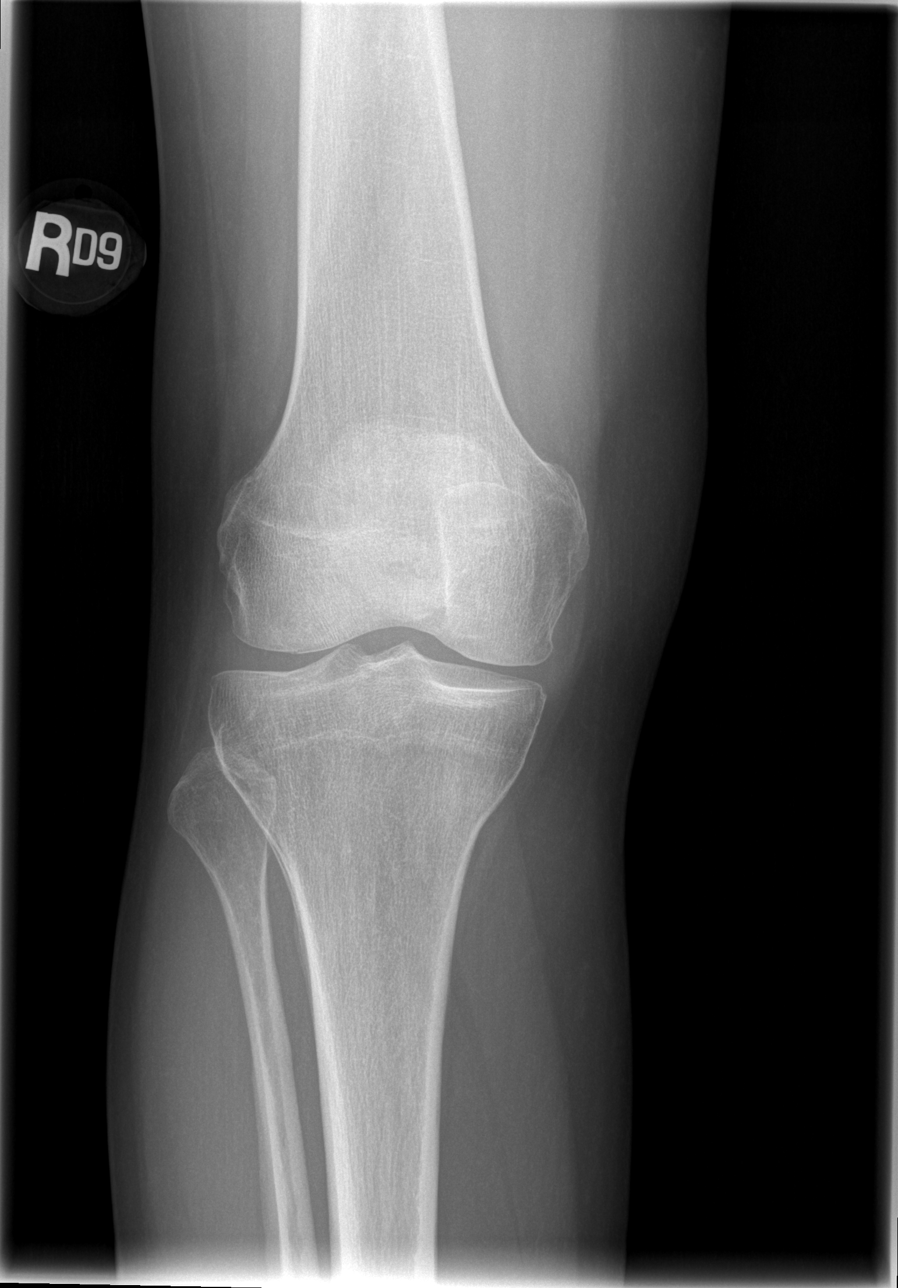

[t knee oblique right (1 of 2)]
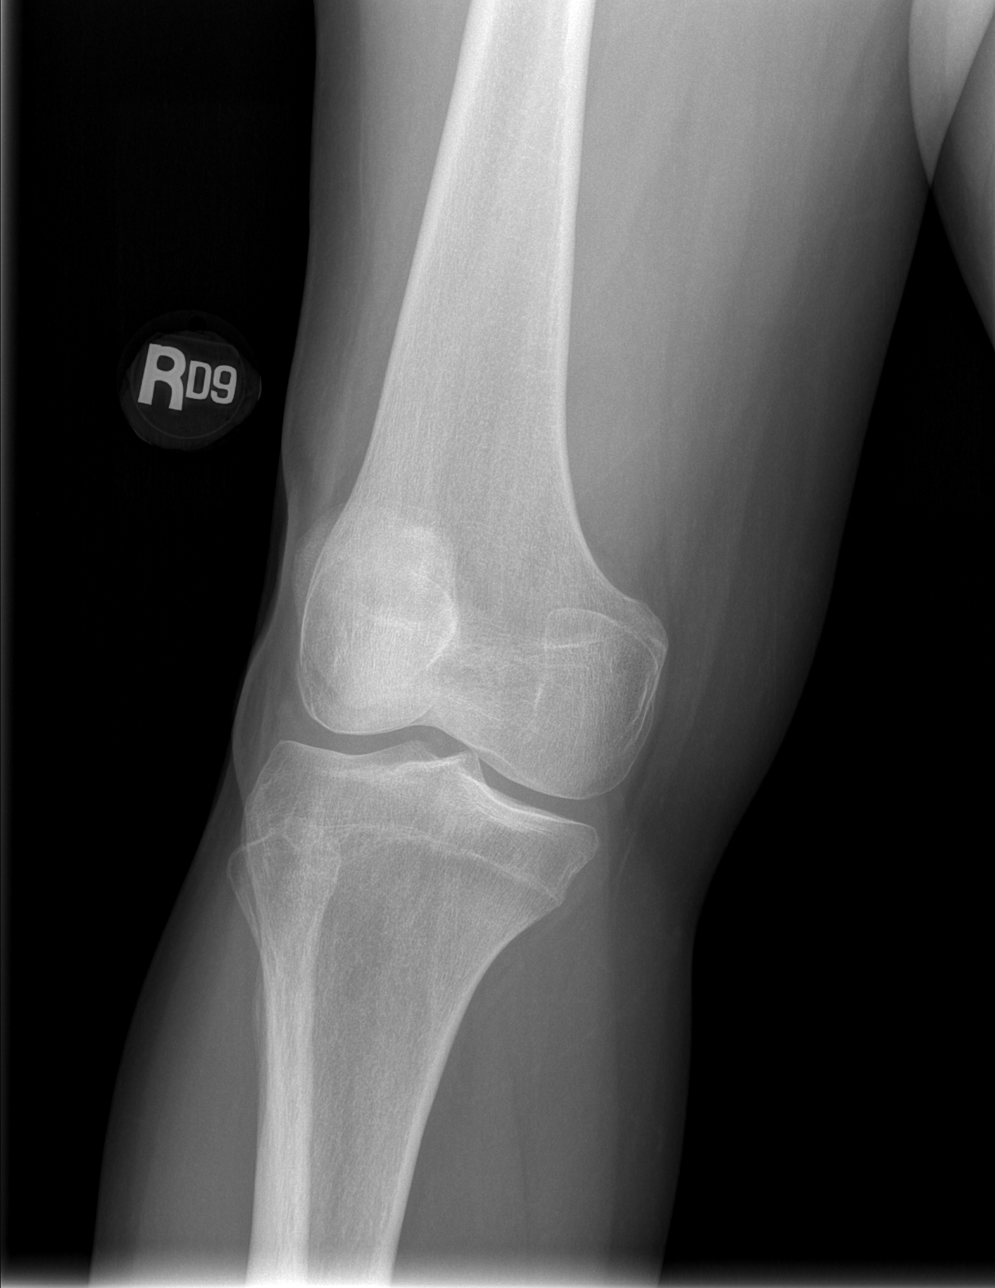

[t knee oblique right (2 of 2)]
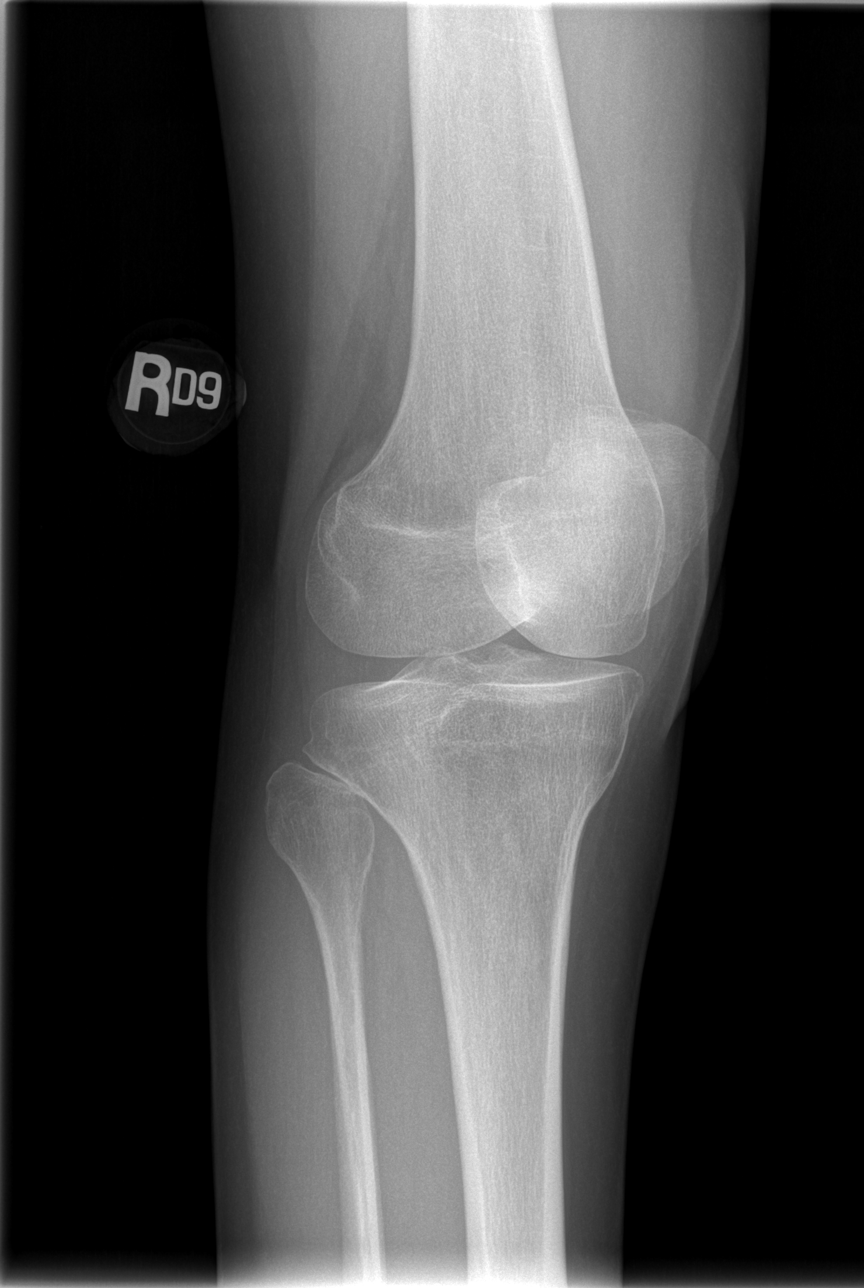

[t knee lat right]
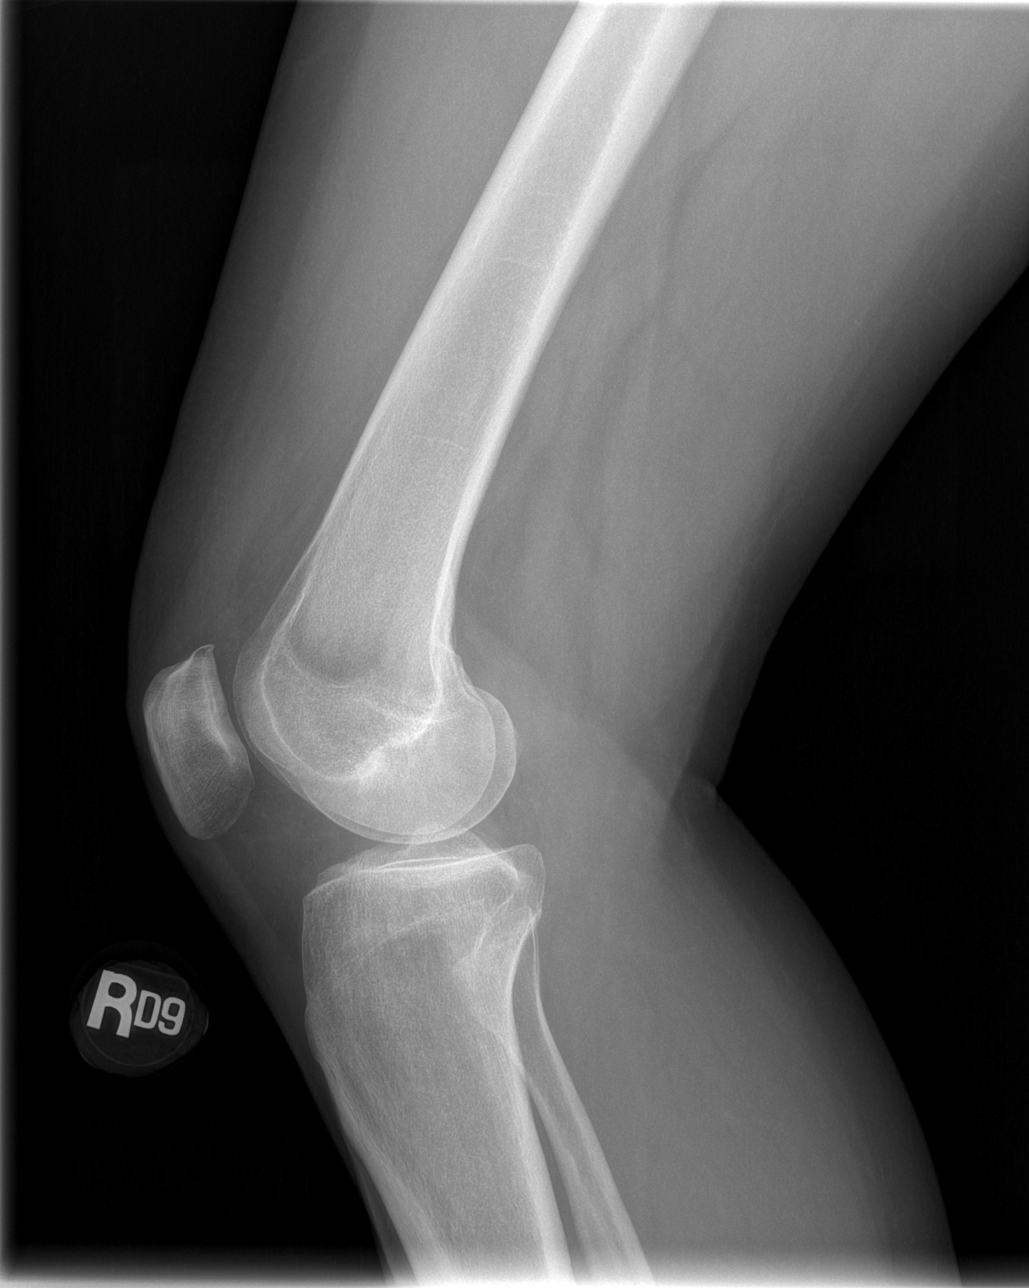

[4 of 4 positions shown; findings below may reference images not displayed]

FINDINGS: No evidence of fracture, dislocation, or joint effusion. Trace
superior patellar spurring. No other evidence of arthropathy or
focal bone abnormality. Soft tissues are unremarkable.
IMPRESSION: Trace superior patellar spurring. Otherwise unremarkable radiographs
of the right knee.

## 2020-05-19 ENCOUNTER — Ambulatory Visit: Payer: PRIVATE HEALTH INSURANCE | Admitting: Certified Nurse Midwife

## 2020-06-24 ENCOUNTER — Telehealth: Payer: Self-pay | Admitting: Obstetrics & Gynecology

## 2020-06-24 NOTE — Telephone Encounter (Signed)
Spoke with pt. Pt asking if ok to have labs drawn at AEX appt with Dr Hyacinth Meeker. Pt advised ok to have labs at appt. Pt verbalized understanding. Pt has AEX with Dr Hyacinth Meeker on 07/13/20 at 1 pm.  Encounter closed.

## 2020-06-24 NOTE — Telephone Encounter (Signed)
Patient would like labs to check thyroid levels when she come in for appointment on 8/30.

## 2020-07-10 NOTE — Progress Notes (Signed)
59 y.o. G30P1001 Married White or Caucasian female here for annual exam.  Denies vaginal bleeding.  Not on HRT.  Prior pt of Debbi Darcel Bayley.    Patient's last menstrual period was 07/16/2011.          Sexually active: Yes.    The current method of family planning is vasectomy.    Exercising: Yes.   walking   Smoker:  no  Health Maintenance: Pap:  10-14-16 neg HPV HR neg History of abnormal Pap:  yes MMG:  04-09-2019 category b density birads 1:neg Colonoscopy:  Age 35 f/u 41yrs.  [facility].  Follow up 10 years.   BMD:   2020.  This is normal.  Follow-up 5 years.   TDaP:  2014 Pneumonia vaccine(s):  no Shingrix:   no Hep C testing: not done Screening Labs: TSH and CMP today   reports that she has quit smoking. She has never used smokeless tobacco. She reports current alcohol use of about 7.0 standard drinks of alcohol per week. She reports that she does not use drugs.  Past Medical History:  Diagnosis Date  . Abnormal Pap smear of cervix   . Anemia    past, age 75  . Cervical high risk HPV (human papillomavirus) test positive   . Genital warts   . HSV-2 (herpes simplex virus 2) infection   . Hypothyroidism   . Mononucleosis   . Osteoarthritis   . Pneumonia 1961  . STD (sexually transmitted disease)    HSV  . UTI (urinary tract infection)     Past Surgical History:  Procedure Laterality Date  . BREAST CYST ASPIRATION     left breast aspiration-negative  . BUNIONECTOMY  6/13  . CRYOTHERAPY     age 70  . TONSILECTOMY, ADENOIDECTOMY, BILATERAL MYRINGOTOMY AND TUBES      Current Outpatient Medications  Medication Sig Dispense Refill  . levothyroxine (SYNTHROID) 88 MCG tablet Take 1 tablet (88 mcg total) by mouth daily. 90 tablet 1  . Multiple Vitamins-Minerals (MULTIVITAMIN) LIQD Take by mouth daily.    . Probiotic Product (PROBIOTIC PO) Take by mouth daily.    . valACYclovir (VALTREX) 500 MG tablet TAKE ONE TABLET BY MOUTH TWICE A DAY FOR 3 DAYS AT ONSET OF OUTBREAK 30  tablet 0   No current facility-administered medications for this visit.    Family History  Problem Relation Age of Onset  . Colitis Mother   . Osteoporosis Mother   . Lung cancer Mother   . Rheum arthritis Mother   . Scoliosis Mother   . Autoimmune disease Mother        sjogren, raynauds,external lupus, EM  . Cancer Father        liposarcoma  . Cancer Maternal Grandfather        colon  . Hypertension Maternal Grandfather   . Hypertension Maternal Grandmother   . Hypertension Paternal Grandmother   . Cancer Paternal Grandmother   . Depression Sister     Review of Systems  Constitutional: Negative.   HENT: Negative.   Eyes: Negative.   Respiratory: Negative.   Cardiovascular: Negative.   Gastrointestinal: Negative.   Endocrine: Negative.   Genitourinary: Negative.   Musculoskeletal: Negative.   Skin: Negative.   Allergic/Immunologic: Negative.   Neurological: Negative.   Hematological: Negative.   Psychiatric/Behavioral: Negative.     Exam:   Ht 5' 7.25" (1.708 m)   Wt 128 lb (58.1 kg)   LMP 07/16/2011   BMI 19.90 kg/m   Height:  5' 7.25" (170.8 cm)  General appearance: alert, cooperative and appears stated age Head: Normocephalic, without obvious abnormality, atraumatic Neck: no adenopathy, supple, symmetrical, trachea midline and thyroid normal to inspection and palpation Lungs: clear to auscultation bilaterally Breasts: normal appearance, no masses or tenderness Heart: regular rate and rhythm Abdomen: soft, non-tender; bowel sounds normal; no masses,  no organomegaly Extremities: extremities normal, atraumatic, no cyanosis or edema Skin: Skin color, texture, turgor normal. No rashes or lesions Lymph nodes: Cervical, supraclavicular, and axillary nodes normal. No abnormal inguinal nodes palpated Neurologic: Grossly normal   Pelvic: External genitalia:  no lesions              Urethra:  normal appearing urethra with no masses, tenderness or lesions               Bartholins and Skenes: normal                 Vagina: normal appearing vagina with normal color and discharge, no lesions              Cervix: no lesions              Pap taken: Yes.   Bimanual Exam:  Uterus:  normal size, contour, position, consistency, mobility, non-tender              Adnexa: normal adnexa and no mass, fullness, tenderness               Rectovaginal: Confirms               Anus:  normal sphincter tone, no lesions  Chaperone, Cornelia Copa, CMA, was present for exam.  A:  Well Woman with normal exam PMP, no HRT Hypothyroidism H/o HSV, no recent outbreaks H/o UTI, no recent infection  P:   Mammogram guidelines reviewed.   pap smear with HR HPV obtained today CMP and TSH obtained today RF for valtrex to pharmacy to hold if pt needs Bactrim DS bid x 3 days to pharmacy to hold if pt needs Colonoscopy referral placed Plan to repeat BMD in 2 years Vaccines reviewed.  Declines shingrix and covid vaccination today. Return annually or prn

## 2020-07-13 ENCOUNTER — Other Ambulatory Visit (HOSPITAL_COMMUNITY)
Admission: RE | Admit: 2020-07-13 | Discharge: 2020-07-13 | Disposition: A | Discharge status: Home / Self Care | Payer: PRIVATE HEALTH INSURANCE | Source: Ambulatory Visit | Attending: Obstetrics & Gynecology | Admitting: Obstetrics & Gynecology

## 2020-07-13 ENCOUNTER — Encounter: Payer: Self-pay | Admitting: Obstetrics & Gynecology

## 2020-07-13 ENCOUNTER — Ambulatory Visit (INDEPENDENT_AMBULATORY_CARE_PROVIDER_SITE_OTHER): Payer: PRIVATE HEALTH INSURANCE | Admitting: Obstetrics & Gynecology

## 2020-07-13 ENCOUNTER — Other Ambulatory Visit: Payer: Self-pay

## 2020-07-13 VITALS — BP 102/64 | HR 68 | Resp 16 | Ht 67.25 in | Wt 128.0 lb

## 2020-07-13 DIAGNOSIS — Z124 Encounter for screening for malignant neoplasm of cervix: Secondary | ICD-10-CM | POA: Insufficient documentation

## 2020-07-13 DIAGNOSIS — Z1211 Encounter for screening for malignant neoplasm of colon: Secondary | ICD-10-CM

## 2020-07-13 DIAGNOSIS — A6000 Herpesviral infection of urogenital system, unspecified: Secondary | ICD-10-CM | POA: Diagnosis not present

## 2020-07-13 DIAGNOSIS — Z01419 Encounter for gynecological examination (general) (routine) without abnormal findings: Secondary | ICD-10-CM | POA: Diagnosis not present

## 2020-07-13 DIAGNOSIS — Z0000 Encounter for general adult medical examination without abnormal findings: Secondary | ICD-10-CM | POA: Diagnosis not present

## 2020-07-13 MED ORDER — VALACYCLOVIR HCL 500 MG PO TABS: ORAL_TABLET | ORAL | 0 refills | Status: DC

## 2020-07-13 MED ORDER — SULFAMETHOXAZOLE-TRIMETHOPRIM 800-160 MG PO TABS: 1.0000 | ORAL_TABLET | Freq: Two times a day (BID) | ORAL | 0 refills | Status: AC

## 2020-07-14 LAB — COMPREHENSIVE METABOLIC PANEL
ALT: 14 IU/L (ref 0–32)
AST: 22 IU/L (ref 0–40)
Albumin/Globulin Ratio: 2.2 (ref 1.2–2.2)
Albumin: 4.9 g/dL (ref 3.8–4.9)
Alkaline Phosphatase: 56 IU/L (ref 48–121)
BUN/Creatinine Ratio: 31 — ABNORMAL HIGH (ref 9–23)
BUN: 22 mg/dL (ref 6–24)
Bilirubin Total: 0.3 mg/dL (ref 0.0–1.2)
CO2: 25 mmol/L (ref 20–29)
Calcium: 9.5 mg/dL (ref 8.7–10.2)
Chloride: 97 mmol/L (ref 96–106)
Creatinine, Ser: 0.72 mg/dL (ref 0.57–1.00)
GFR calc Af Amer: 106 mL/min/{1.73_m2} (ref >59)
GFR calc non Af Amer: 92 mL/min/{1.73_m2} (ref >59)
Globulin, Total: 2.2 g/dL (ref 1.5–4.5)
Glucose: 86 mg/dL (ref 65–99)
Potassium: 4.2 mmol/L (ref 3.5–5.2)
Sodium: 135 mmol/L (ref 134–144)
Total Protein: 7.1 g/dL (ref 6.0–8.5)

## 2020-07-14 LAB — TSH: TSH: 1.86 u[IU]/mL (ref 0.450–4.500)

## 2020-07-15 LAB — CYTOLOGY - PAP
Comment: NEGATIVE
Diagnosis: UNDETERMINED — AB
High risk HPV: NEGATIVE

## 2020-07-21 ENCOUNTER — Other Ambulatory Visit: Payer: Self-pay

## 2020-07-23 ENCOUNTER — Other Ambulatory Visit: Payer: Self-pay | Admitting: Family Medicine

## 2020-07-23 DIAGNOSIS — E039 Hypothyroidism, unspecified: Secondary | ICD-10-CM

## 2020-07-28 ENCOUNTER — Other Ambulatory Visit: Payer: Self-pay | Admitting: Obstetrics & Gynecology

## 2020-07-28 DIAGNOSIS — E039 Hypothyroidism, unspecified: Secondary | ICD-10-CM

## 2020-07-28 NOTE — Telephone Encounter (Signed)
Patient requesting refill of synthroid. [facility] [phone].

## 2020-07-28 NOTE — Telephone Encounter (Signed)
Medication refill request: Levothyroxine 88mg   Last AEX:  07/13/20 Next AEX: not yet scheduled Last MMG (if hormonal medication request): NA Refill authorized: 30/6   Spoke with patient because I noticed that her thyroid medication was just filled on 07/23/20 by her PCP. She said that the pharmacy sent the script to the wrong dr and they do not know that she had normal results on her last test. She said that [facility] is holding the prescription and she is just wanting it to come from you since you are managing the labs.   Pt said that she will reschedule to have her BUN checked - she said that she was not feeling well the other day when she was originally scheduled.   09/22/20, CMA

## 2020-07-29 NOTE — Telephone Encounter (Signed)
Could you confirm she wants DAW?  I don't see this on her prior prescriptions.  Also, can you see if she wants 90 day supply instead of 30 day supply?  Thank you.

## 2020-07-30 NOTE — Telephone Encounter (Signed)
Left message for pt to call back and advise if she wants 90 day supply and if she needs script written as DAW as that has not been on her previous scripts.   Julious Payer, CMA

## 2020-07-31 NOTE — Telephone Encounter (Signed)
Left message for pt to call office.   Cassandra Alvarez, CMA  

## 2020-08-04 ENCOUNTER — Other Ambulatory Visit: Payer: Self-pay

## 2020-08-04 DIAGNOSIS — R899 Unspecified abnormal finding in specimens from other organs, systems and tissues: Secondary | ICD-10-CM

## 2020-08-04 NOTE — Telephone Encounter (Signed)
Spoke with patient and she said that she usually uses the generic levothyroxine - does not need it written as DAW. She said that if she can get a 90 day supply - that would be great.   Cassandra Alvarez Cassandra Alvarez

## 2020-08-04 NOTE — Telephone Encounter (Signed)
Patient left message returning call.

## 2020-08-05 MED ORDER — LEVOTHYROXINE SODIUM 88 MCG PO TABS: 88.0000 ug | ORAL_TABLET | Freq: Every day | ORAL | 1 refills | Status: DC

## 2020-08-07 ENCOUNTER — Other Ambulatory Visit (INDEPENDENT_AMBULATORY_CARE_PROVIDER_SITE_OTHER): Payer: PRIVATE HEALTH INSURANCE

## 2020-08-07 ENCOUNTER — Other Ambulatory Visit: Payer: Self-pay

## 2020-08-07 DIAGNOSIS — R899 Unspecified abnormal finding in specimens from other organs, systems and tissues: Secondary | ICD-10-CM

## 2020-09-22 ENCOUNTER — Telehealth: Payer: Self-pay

## 2020-09-22 NOTE — Telephone Encounter (Signed)
Left detailed message per DPR. Pt advised to keep referral appt with Dr Ewing Schlein on 10/02/20 and discuss with him about colonoscopy vs Cologuard. Pt to return call with any questions or concerns.  Encounter closed.

## 2020-09-22 NOTE — Telephone Encounter (Signed)
Patient is calling in regards to "referral for colonoscopy and would like to know if she can discuss having cologuard prescribed instead".

## 2021-02-02 ENCOUNTER — Other Ambulatory Visit: Payer: Self-pay | Admitting: Obstetrics & Gynecology

## 2021-02-02 DIAGNOSIS — E039 Hypothyroidism, unspecified: Secondary | ICD-10-CM

## 2021-06-28 ENCOUNTER — Other Ambulatory Visit: Payer: Self-pay | Admitting: Family Medicine

## 2021-06-28 ENCOUNTER — Telehealth: Payer: Self-pay

## 2021-06-28 DIAGNOSIS — Z0000 Encounter for general adult medical examination without abnormal findings: Secondary | ICD-10-CM

## 2021-06-28 NOTE — Telephone Encounter (Signed)
Pts cpe was scheduled. Lab apt was made a couple days prior since her cpe is late in the day.   Can you place her orders?

## 2021-07-15 ENCOUNTER — Ambulatory Visit (HOSPITAL_BASED_OUTPATIENT_CLINIC_OR_DEPARTMENT_OTHER): Payer: PRIVATE HEALTH INSURANCE | Admitting: Obstetrics & Gynecology

## 2021-07-20 ENCOUNTER — Other Ambulatory Visit: Payer: PRIVATE HEALTH INSURANCE

## 2021-07-23 ENCOUNTER — Encounter: Payer: PRIVATE HEALTH INSURANCE | Admitting: Family Medicine

## 2021-08-19 ENCOUNTER — Other Ambulatory Visit (INDEPENDENT_AMBULATORY_CARE_PROVIDER_SITE_OTHER): Payer: PRIVATE HEALTH INSURANCE

## 2021-08-19 ENCOUNTER — Other Ambulatory Visit: Payer: Self-pay

## 2021-08-19 DIAGNOSIS — Z0000 Encounter for general adult medical examination without abnormal findings: Secondary | ICD-10-CM | POA: Diagnosis not present

## 2021-08-19 LAB — CBC WITH DIFFERENTIAL/PLATELET
Basophils Absolute: 0 10*3/uL (ref 0.0–0.1)
Basophils Relative: 0.5 % (ref 0.0–3.0)
Eosinophils Absolute: 0 10*3/uL (ref 0.0–0.7)
Eosinophils Relative: 0.9 % (ref 0.0–5.0)
HCT: 40.6 % (ref 36.0–46.0)
Hemoglobin: 13.5 g/dL (ref 12.0–15.0)
Lymphocytes Relative: 53.2 % — ABNORMAL HIGH (ref 12.0–46.0)
Lymphs Abs: 2.3 10*3/uL (ref 0.7–4.0)
MCHC: 33.3 g/dL (ref 30.0–36.0)
MCV: 88.7 fl (ref 78.0–100.0)
Monocytes Absolute: 0.4 10*3/uL (ref 0.1–1.0)
Monocytes Relative: 9.7 % (ref 3.0–12.0)
Neutro Abs: 1.6 10*3/uL (ref 1.4–7.7)
Neutrophils Relative %: 35.7 % — ABNORMAL LOW (ref 43.0–77.0)
Platelets: 353 10*3/uL (ref 150.0–400.0)
RBC: 4.58 Mil/uL (ref 3.87–5.11)
RDW: 13.8 % (ref 11.5–15.5)
WBC: 4.4 10*3/uL (ref 4.0–10.5)

## 2021-08-19 LAB — COMPREHENSIVE METABOLIC PANEL WITH GFR
ALT: 13 U/L (ref 0–35)
AST: 18 U/L (ref 0–37)
Albumin: 4.9 g/dL (ref 3.5–5.2)
Alkaline Phosphatase: 46 U/L (ref 39–117)
BUN: 22 mg/dL (ref 6–23)
CO2: 32 meq/L (ref 19–32)
Calcium: 10 mg/dL (ref 8.4–10.5)
Chloride: 98 meq/L (ref 96–112)
Creatinine, Ser: 0.81 mg/dL (ref 0.40–1.20)
GFR: 78.53 mL/min
Glucose, Bld: 85 mg/dL (ref 70–99)
Potassium: 4.4 meq/L (ref 3.5–5.1)
Sodium: 135 meq/L (ref 135–145)
Total Bilirubin: 0.6 mg/dL (ref 0.2–1.2)
Total Protein: 7.5 g/dL (ref 6.0–8.3)

## 2021-08-19 LAB — LIPID PANEL
Cholesterol: 197 mg/dL (ref 0–200)
HDL: 75.5 mg/dL (ref >39.00)
LDL Cholesterol: 107 mg/dL — ABNORMAL HIGH (ref 0–99)
NonHDL: 121.4
Total CHOL/HDL Ratio: 3
Triglycerides: 71 mg/dL (ref 0.0–149.0)
VLDL: 14.2 mg/dL (ref 0.0–40.0)

## 2021-08-19 LAB — TSH: TSH: 1.43 u[IU]/mL (ref 0.35–5.50)

## 2021-08-20 ENCOUNTER — Encounter: Payer: Self-pay | Admitting: Family Medicine

## 2021-08-20 ENCOUNTER — Ambulatory Visit (INDEPENDENT_AMBULATORY_CARE_PROVIDER_SITE_OTHER): Payer: PRIVATE HEALTH INSURANCE | Admitting: Family Medicine

## 2021-08-20 VITALS — BP 110/80 | HR 80 | Temp 98.7°F | Resp 18 | Ht 67.0 in | Wt 126.6 lb

## 2021-08-20 DIAGNOSIS — Z0000 Encounter for general adult medical examination without abnormal findings: Secondary | ICD-10-CM | POA: Diagnosis not present

## 2021-08-20 DIAGNOSIS — K635 Polyp of colon: Secondary | ICD-10-CM | POA: Diagnosis not present

## 2021-08-20 DIAGNOSIS — A6000 Herpesviral infection of urogenital system, unspecified: Secondary | ICD-10-CM

## 2021-08-20 DIAGNOSIS — Z1211 Encounter for screening for malignant neoplasm of colon: Secondary | ICD-10-CM | POA: Diagnosis not present

## 2021-08-20 DIAGNOSIS — E039 Hypothyroidism, unspecified: Secondary | ICD-10-CM

## 2021-08-20 MED ORDER — VALACYCLOVIR HCL 500 MG PO TABS: ORAL_TABLET | ORAL | 0 refills | Status: AC

## 2021-08-20 NOTE — Patient Instructions (Signed)
Preventive Care 40-61 Years Old, Female Preventive care refers to lifestyle choices and visits with your health care provider that can promote health and wellness. This includes: A yearly physical exam. This is also called an annual wellness visit. Regular dental and eye exams. Immunizations. Screening for certain conditions. Healthy lifestyle choices, such as: Eating a healthy diet. Getting regular exercise. Not using drugs or products that contain nicotine and tobacco. Limiting alcohol use. What can I expect for my preventive care visit? Physical exam Your health care provider will check your: Height and weight. These may be used to calculate your BMI (body mass index). BMI is a measurement that tells if you are at a healthy weight. Heart rate and blood pressure. Body temperature. Skin for abnormal spots. Counseling Your health care provider may ask you questions about your: Past medical problems. Family's medical history. Alcohol, tobacco, and drug use. Emotional well-being. Home life and relationship well-being. Sexual activity. Diet, exercise, and sleep habits. Work and work environment. Access to firearms. Method of birth control. Menstrual cycle. Pregnancy history. What immunizations do I need? Vaccines are usually given at various ages, according to a schedule. Your health care provider will recommend vaccines for you based on your age, medical history, and lifestyle or other factors, such as travel or where you work. What tests do I need? Blood tests Lipid and cholesterol levels. These may be checked every 5 years, or more often if you are over 50 years old. Hepatitis C test. Hepatitis B test. Screening Lung cancer screening. You may have this screening every year starting at age 55 if you have a 30-pack-year history of smoking and currently smoke or have quit within the past 15 years. Colorectal cancer screening. All adults should have this screening starting at  age 50 and continuing until age 75. Your health care provider may recommend screening at age 45 if you are at increased risk. You will have tests every 1-10 years, depending on your results and the type of screening test. Diabetes screening. This is done by checking your blood sugar (glucose) after you have not eaten for a while (fasting). You may have this done every 1-3 years. Mammogram. This may be done every 1-2 years. Talk with your health care provider about when you should start having regular mammograms. This may depend on whether you have a family history of breast cancer. BRCA-related cancer screening. This may be done if you have a family history of breast, ovarian, tubal, or peritoneal cancers. Pelvic exam and Pap test. This may be done every 3 years starting at age 21. Starting at age 30, this may be done every 5 years if you have a Pap test in combination with an HPV test. Other tests STD (sexually transmitted disease) testing, if you are at risk. Bone density scan. This is done to screen for osteoporosis. You may have this scan if you are at high risk for osteoporosis. Talk with your health care provider about your test results, treatment options, and if necessary, the need for more tests. Follow these instructions at home: Eating and drinking  Eat a diet that includes fresh fruits and vegetables, whole grains, lean protein, and low-fat dairy products. Take vitamin and mineral supplements as recommended by your health care provider. Do not drink alcohol if: Your health care provider tells you not to drink. You are pregnant, may be pregnant, or are planning to become pregnant. If you drink alcohol: Limit how much you have to 0-1 drink a day. Be   aware of how much alcohol is in your drink. In the [place] one drink equals one 12 oz bottle of beer (355 mL), one 5 oz glass of wine (148 mL), or one 1 oz glass of hard liquor (44 mL). Lifestyle Take daily care of your teeth and  gums. Brush your teeth every morning and night with fluoride toothpaste. Floss one time each day. Stay active. Exercise for at least 30 minutes 5 or more days each week. Do not use any products that contain nicotine or tobacco, such as cigarettes, e-cigarettes, and chewing tobacco. If you need help quitting, ask your health care provider. Do not use drugs. If you are sexually active, practice safe sex. Use a condom or other form of protection to prevent STIs (sexually transmitted infections). If you do not wish to become pregnant, use a form of birth control. If you plan to become pregnant, see your health care provider for a prepregnancy visit. If told by your health care provider, take low-dose aspirin daily starting at age 63. Find healthy ways to cope with stress, such as: Meditation, yoga, or listening to music. Journaling. Talking to a trusted person. Spending time with friends and family. Safety Always wear your seat belt while driving or riding in a vehicle. Do not drive: If you have been drinking alcohol. Do not ride with someone who has been drinking. When you are tired or distracted. While texting. Wear a helmet and other protective equipment during sports activities. If you have firearms in your house, make sure you follow all gun safety procedures. What's next? Visit your health care provider once a year for an annual wellness visit. Ask your health care provider how often you should have your eyes and teeth checked. Stay up to date on all vaccines. This information is not intended to replace advice given to you by your health care provider. Make sure you discuss any questions you have with your health care provider. Document Revised: 01/08/2021 Document Reviewed: 07/12/2018 Elsevier Patient Education  2022 [employer].

## 2021-08-20 NOTE — Assessment & Plan Note (Signed)
Check labs Cont' meds 

## 2021-08-20 NOTE — Assessment & Plan Note (Signed)
Colon ordered Check labs See AVS

## 2021-08-20 NOTE — Progress Notes (Signed)
.   Subjective:   By signing my name below, I, Zite Okoli, attest that this documentation has been prepared under the direction and in the presence of Zola Button, Myrene Buddy R DO. 08/20/2021     Patient ID: Cassandra Alvarez, female    DOB: 04-16-1961, 60 y.o.   MRN: [id number]  Chief Complaint  Patient presents with   Annual Exam    Pt states not fasting. Had labs yesterday     HPI Patient is in today for a comprehensive physical exam.  She reports she has started having symptoms of arthritis but does not think they are severe enough to start using medications.  She is requesting for a refill on 500 mg valtrex.  She denies fever, hearing loss, ear pain,congestion, sinus pain, sore throat, eye pain, chest pain, palpitations, cough, shortness of breath, wheezing, nausea. vomiting, diarrhea, constipation, blood in stool, dysuria,frequency, hematuria and headaches.   She is not interested in getting the Covid-19 and shingles vaccines.  Past Medical History:  Diagnosis Date   Abnormal Pap smear of cervix    Anemia    past, age 29   Cervical high risk HPV (human papillomavirus) test positive    Genital warts    HSV-2 (herpes simplex virus 2) infection    Hypothyroidism    Mononucleosis    Osteoarthritis    Pneumonia 1961   STD (sexually transmitted disease)    HSV   UTI (urinary tract infection)     Past Surgical History:  Procedure Laterality Date   BREAST CYST ASPIRATION     left breast aspiration-negative   BUNIONECTOMY  6/13   CRYOTHERAPY     age 29   TONSILECTOMY, ADENOIDECTOMY, BILATERAL MYRINGOTOMY AND TUBES      Family History  Problem Relation Age of Onset   Colitis Mother    Osteoporosis Mother    Lung cancer Mother    Rheum arthritis Mother    Scoliosis Mother    Autoimmune disease Mother        sjogren, raynauds,external lupus, EM   Cancer Father        liposarcoma   Cancer Maternal Grandfather        colon   Hypertension Maternal Grandfather     Hypertension Maternal Grandmother    Hypertension Paternal Grandmother    Cancer Paternal Grandmother    Depression Sister     Social History   Socioeconomic History   Marital status: Married    Spouse name: Not on file   Number of children: Not on file   Years of education: Not on file   Highest education level: Not on file  Occupational History   Not on file  Tobacco Use   Smoking status: Former   Smokeless tobacco: Never  Substance and Sexual Activity   Alcohol use: Yes    Alcohol/week: 7.0 standard drinks    Types: 7 Standard drinks or equivalent per week   Drug use: No   Sexual activity: Yes    Partners: Male    Comment: husband vasectomy  Other Topics Concern   Not on file  Social History Narrative   Not on file   Social Determinants of Health   Financial Resource Strain: Not on file  Food Insecurity: Not on file  Transportation Needs: Not on file  Physical Activity: Not on file  Stress: Not on file  Social Connections: Not on file  Intimate Partner Violence: Not on file    Outpatient Medications Prior to Visit  Medication Sig Dispense Refill   EUTHYROX 88 MCG tablet TAKE 1 TABLET BY MOUTH ONCE DAILY BEFORE BREAKFAST 90 tablet 2   Multiple Vitamins-Minerals (MULTIVITAMIN) LIQD Take by mouth daily.     Probiotic Product (PROBIOTIC PO) Take by mouth daily.     sulfamethoxazole-trimethoprim (BACTRIM DS) 800-160 MG tablet Take 1 tablet by mouth 2 (two) times daily. 6 tablet 0   valACYclovir (VALTREX) 500 MG tablet TAKE ONE TABLET BY MOUTH TWICE A DAY FOR 3 DAYS AT ONSET OF OUTBREAK 30 tablet 0   No facility-administered medications prior to visit.    No Known Allergies  Review of Systems  Constitutional:  Negative for fever.  HENT:  Negative for congestion, ear pain, hearing loss, sinus pain and sore throat.   Eyes:  Negative for blurred vision and pain.  Respiratory:  Negative for cough, sputum production, shortness of breath and wheezing.    Cardiovascular:  Negative for chest pain and palpitations.  Gastrointestinal:  Negative for blood in stool, constipation, diarrhea, nausea and vomiting.  Genitourinary:  Negative for dysuria, frequency, hematuria and urgency.  Musculoskeletal:  Negative for back pain, falls and myalgias.  Neurological:  Negative for dizziness, sensory change, loss of consciousness, weakness and headaches.  Endo/Heme/Allergies:  Negative for environmental allergies. Does not bruise/bleed easily.  Psychiatric/Behavioral:  Negative for depression and suicidal ideas. The patient is not nervous/anxious and does not have insomnia.       Objective:    Physical Exam Constitutional:      General: She is not in acute distress.    Appearance: Normal appearance. She is not ill-appearing.  HENT:     Head: Normocephalic and atraumatic.     Right Ear: External ear normal.     Left Ear: External ear normal.  Eyes:     Extraocular Movements: Extraocular movements intact.     Pupils: Pupils are equal, round, and reactive to light.  Cardiovascular:     Rate and Rhythm: Normal rate and regular rhythm.     Pulses: Normal pulses.     Heart sounds: Normal heart sounds. No murmur heard.   No gallop.  Pulmonary:     Effort: Pulmonary effort is normal. No respiratory distress.     Breath sounds: Normal breath sounds. No wheezing, rhonchi or rales.  Abdominal:     General: Bowel sounds are normal. There is no distension.     Palpations: Abdomen is soft. There is no mass.     Tenderness: There is no abdominal tenderness. There is no guarding or rebound.     Hernia: No hernia is present.  Musculoskeletal:     Cervical back: Normal range of motion and neck supple.  Lymphadenopathy:     Cervical: No cervical adenopathy.  Skin:    General: Skin is warm and dry.  Neurological:     Mental Status: She is alert and oriented to person, place, and time.  Psychiatric:        Behavior: Behavior normal.    BP 110/80 (BP  Location: Left Arm, Patient Position: Sitting, Cuff Size: Normal)   Pulse 80   Temp 98.7 F (37.1 C) (Oral)   Resp 18   Ht 5\' 7"  (1.702 m)   Wt 126 lb 9.6 oz (57.4 kg)   LMP 07/16/2011   SpO2 (!) 79%   BMI 19.83 kg/m  Wt Readings from Last 3 Encounters:  08/20/21 126 lb 9.6 oz (57.4 kg)  07/13/20 128 lb (58.1 kg)  12/11/19 132 lb (59.9 kg)  Diabetic Foot Exam - Simple   No data filed    Lab Results  Component Value Date   WBC 4.4 08/19/2021   HGB 13.5 08/19/2021   HCT 40.6 08/19/2021   PLT 353.0 08/19/2021   GLUCOSE 85 08/19/2021   CHOL 197 08/19/2021   TRIG 71.0 08/19/2021   HDL 75.50 08/19/2021   LDLCALC 107 (H) 08/19/2021   ALT 13 08/19/2021   AST 18 08/19/2021   NA 135 08/19/2021   K 4.4 08/19/2021   CL 98 08/19/2021   CREATININE 0.81 08/19/2021   BUN 22 08/19/2021   CO2 32 08/19/2021   TSH 1.43 08/19/2021    Lab Results  Component Value Date   TSH 1.43 08/19/2021   Lab Results  Component Value Date   WBC 4.4 08/19/2021   HGB 13.5 08/19/2021   HCT 40.6 08/19/2021   MCV 88.7 08/19/2021   PLT 353.0 08/19/2021   Lab Results  Component Value Date   NA 135 08/19/2021   K 4.4 08/19/2021   CO2 32 08/19/2021   GLUCOSE 85 08/19/2021   BUN 22 08/19/2021   CREATININE 0.81 08/19/2021   BILITOT 0.6 08/19/2021   ALKPHOS 46 08/19/2021   AST 18 08/19/2021   ALT 13 08/19/2021   PROT 7.5 08/19/2021   ALBUMIN 4.9 08/19/2021   CALCIUM 10.0 08/19/2021   GFR 78.53 08/19/2021   Lab Results  Component Value Date   CHOL 197 08/19/2021   Lab Results  Component Value Date   HDL 75.50 08/19/2021   Lab Results  Component Value Date   LDLCALC 107 (H) 08/19/2021   Lab Results  Component Value Date   TRIG 71.0 08/19/2021   Lab Results  Component Value Date   CHOLHDL 3 08/19/2021   No results found for: HGBA1C      Mammogram: Last completed on 04/09/2019. Results were normal. Repeat in 1 year. Due Dexa: Last completed on 07/31/2019. Results showed  patient is normal according to the [employer].  Colonoscopy: Never completed. Pap Smear: Last completed on 07/13/2020. Results were normal. Repeat in 3 years.   Assessment & Plan:   Problem List Items Addressed This Visit       Unprioritized   Hypothyroidism    Check labs Cont meds       Preventative health care - Primary    Colon ordered Check labs See AVS      Other Visit Diagnoses     Genital herpes simplex, unspecified site       Relevant Medications   valACYclovir (VALTREX) 500 MG tablet   Polyp of colon, unspecified part of colon, unspecified type       Relevant Orders   Ambulatory referral to Gastroenterology   Screening for colon cancer       Relevant Orders   Cologuard       Meds ordered this encounter  Medications   valACYclovir (VALTREX) 500 MG tablet    Sig: TAKE ONE TABLET BY MOUTH TWICE A DAY FOR 3 DAYS AT ONSET OF OUTBREAK    Dispense:  30 tablet    Refill:  0    Hold prescription for pt.  She will call if/when needs RF.    I,Zite Okoli,acting as a Neurosurgeon for [employer], DO.,have documented all relevant documentation on the behalf of Donato Schultz, DO,as directed by  Donato Schultz, DO while in the presence of Donato Schultz, DO.   I, Zola Button, [place]  R DO., personally preformed the services described in this documentation.  All medical record entries made by the scribe were at my direction and in my presence.  I have reviewed the chart and discharge instructions (if applicable) and agree that the record reflects my personal performance and is accurate and complete. 08/20/2021

## 2021-11-01 ENCOUNTER — Other Ambulatory Visit: Payer: Self-pay | Admitting: Obstetrics & Gynecology

## 2021-11-01 DIAGNOSIS — E039 Hypothyroidism, unspecified: Secondary | ICD-10-CM

## 2021-11-03 ENCOUNTER — Other Ambulatory Visit: Payer: Self-pay | Admitting: Family Medicine

## 2021-11-03 DIAGNOSIS — E039 Hypothyroidism, unspecified: Secondary | ICD-10-CM

## 2021-11-05 MED ORDER — LEVOTHYROXINE SODIUM 88 MCG PO TABS: 88.0000 ug | ORAL_TABLET | Freq: Every day | ORAL | 3 refills | Status: AC

## 2022-01-20 ENCOUNTER — Other Ambulatory Visit: Payer: Self-pay | Admitting: Obstetrics & Gynecology

## 2022-01-20 DIAGNOSIS — Z1231 Encounter for screening mammogram for malignant neoplasm of breast: Secondary | ICD-10-CM

## 2022-01-28 ENCOUNTER — Ambulatory Visit
Admission: RE | Admit: 2022-01-28 | Discharge: 2022-01-28 | Disposition: A | Discharge status: Home / Self Care | Payer: No Typology Code available for payment source | Source: Ambulatory Visit | Attending: Obstetrics & Gynecology | Admitting: Obstetrics & Gynecology

## 2022-01-28 DIAGNOSIS — Z1231 Encounter for screening mammogram for malignant neoplasm of breast: Secondary | ICD-10-CM

## 2022-09-28 ENCOUNTER — Ambulatory Visit (HOSPITAL_BASED_OUTPATIENT_CLINIC_OR_DEPARTMENT_OTHER): Payer: No Typology Code available for payment source | Admitting: Obstetrics & Gynecology

## 2023-05-15 ENCOUNTER — Encounter: Payer: Self-pay | Admitting: Family Medicine

## 2023-05-23 NOTE — Telephone Encounter (Signed)
error
